# Patient Record
Sex: Female | Born: 1960 | Race: White | Hispanic: No | Marital: Married | Smoking: Never smoker
Health system: Southern US, Community
[De-identification: ages and names within clinical notes are randomized; demographics above are authoritative.]

## PROBLEM LIST (undated history)

## (undated) DIAGNOSIS — G8929 Other chronic pain: Secondary | ICD-10-CM

## (undated) DIAGNOSIS — T7840XA Allergy, unspecified, initial encounter: Secondary | ICD-10-CM

## (undated) DIAGNOSIS — F419 Anxiety disorder, unspecified: Secondary | ICD-10-CM

## (undated) DIAGNOSIS — I10 Essential (primary) hypertension: Secondary | ICD-10-CM

## (undated) DIAGNOSIS — Z9289 Personal history of other medical treatment: Secondary | ICD-10-CM

## (undated) DIAGNOSIS — N390 Urinary tract infection, site not specified: Secondary | ICD-10-CM

## (undated) DIAGNOSIS — L409 Psoriasis, unspecified: Secondary | ICD-10-CM

## (undated) DIAGNOSIS — R519 Headache, unspecified: Secondary | ICD-10-CM

## (undated) DIAGNOSIS — N200 Calculus of kidney: Secondary | ICD-10-CM

## (undated) DIAGNOSIS — K859 Acute pancreatitis without necrosis or infection, unspecified: Secondary | ICD-10-CM

## (undated) DIAGNOSIS — K219 Gastro-esophageal reflux disease without esophagitis: Secondary | ICD-10-CM

## (undated) DIAGNOSIS — R51 Headache: Secondary | ICD-10-CM

## (undated) HISTORY — DX: Gastro-esophageal reflux disease without esophagitis: K21.9

## (undated) HISTORY — PX: COLONOSCOPY W/ POLYPECTOMY: SHX1380

## (undated) HISTORY — DX: Acute pancreatitis without necrosis or infection, unspecified: K85.90

## (undated) HISTORY — DX: Essential (primary) hypertension: I10

## (undated) HISTORY — DX: Urinary tract infection, site not specified: N39.0

## (undated) HISTORY — PX: LEG SURGERY: SHX1003

## (undated) HISTORY — DX: Headache: R51

## (undated) HISTORY — DX: Allergy, unspecified, initial encounter: T78.40XA

## (undated) HISTORY — DX: Anxiety disorder, unspecified: F41.9

## (undated) HISTORY — DX: Other chronic pain: G89.29

## (undated) HISTORY — PX: LITHOTRIPSY: SUR834

## (undated) HISTORY — DX: Personal history of other medical treatment: Z92.89

## (undated) HISTORY — PX: KIDNEY STONE SURGERY: SHX686

## (undated) HISTORY — PX: FOOT FRACTURE SURGERY: SHX645

## (undated) HISTORY — DX: Headache, unspecified: R51.9

## (undated) HISTORY — DX: Psoriasis, unspecified: L40.9

## (undated) HISTORY — PX: WISDOM TOOTH EXTRACTION: SHX21

## (undated) HISTORY — PX: CHOLECYSTECTOMY: SHX55

---

## 1998-11-06 ENCOUNTER — Other Ambulatory Visit: Admission: RE | Admit: 1998-11-06 | Discharge: 1998-11-06 | Payer: Self-pay | Admitting: Obstetrics & Gynecology

## 2000-01-12 ENCOUNTER — Other Ambulatory Visit: Admission: RE | Admit: 2000-01-12 | Discharge: 2000-01-12 | Payer: Self-pay | Admitting: Obstetrics & Gynecology

## 2000-01-24 ENCOUNTER — Emergency Department (HOSPITAL_COMMUNITY): Admission: EM | Admit: 2000-01-24 | Discharge: 2000-01-24 | Payer: Self-pay | Admitting: *Deleted

## 2000-01-24 ENCOUNTER — Encounter: Payer: Self-pay | Admitting: *Deleted

## 2000-02-18 ENCOUNTER — Encounter: Admission: RE | Admit: 2000-02-18 | Discharge: 2000-02-18 | Payer: Self-pay | Admitting: Urology

## 2000-02-18 ENCOUNTER — Encounter: Payer: Self-pay | Admitting: Urology

## 2000-02-22 ENCOUNTER — Ambulatory Visit (HOSPITAL_COMMUNITY): Admission: RE | Admit: 2000-02-22 | Discharge: 2000-02-22 | Payer: Self-pay | Admitting: Urology

## 2000-02-22 ENCOUNTER — Encounter: Payer: Self-pay | Admitting: Urology

## 2000-03-14 ENCOUNTER — Encounter: Payer: Self-pay | Admitting: Urology

## 2000-03-15 ENCOUNTER — Inpatient Hospital Stay (HOSPITAL_COMMUNITY): Admission: EM | Admit: 2000-03-15 | Discharge: 2000-03-17 | Payer: Self-pay | Admitting: Urology

## 2000-03-16 ENCOUNTER — Encounter: Payer: Self-pay | Admitting: Urology

## 2000-03-17 ENCOUNTER — Encounter: Payer: Self-pay | Admitting: Urology

## 2000-04-18 ENCOUNTER — Ambulatory Visit (HOSPITAL_COMMUNITY): Admission: RE | Admit: 2000-04-18 | Discharge: 2000-04-18 | Payer: Self-pay | Admitting: Urology

## 2001-01-04 ENCOUNTER — Encounter: Payer: Self-pay | Admitting: Urology

## 2001-01-04 ENCOUNTER — Encounter: Admission: RE | Admit: 2001-01-04 | Discharge: 2001-01-04 | Payer: Self-pay | Admitting: Urology

## 2001-03-27 ENCOUNTER — Other Ambulatory Visit: Admission: RE | Admit: 2001-03-27 | Discharge: 2001-03-27 | Payer: Self-pay | Admitting: Obstetrics & Gynecology

## 2001-09-04 ENCOUNTER — Encounter: Payer: Self-pay | Admitting: Gastroenterology

## 2002-09-03 ENCOUNTER — Other Ambulatory Visit: Admission: RE | Admit: 2002-09-03 | Discharge: 2002-09-03 | Payer: Self-pay | Admitting: Obstetrics & Gynecology

## 2003-12-13 ENCOUNTER — Other Ambulatory Visit: Admission: RE | Admit: 2003-12-13 | Discharge: 2003-12-13 | Payer: Self-pay | Admitting: Obstetrics & Gynecology

## 2005-03-01 ENCOUNTER — Other Ambulatory Visit: Admission: RE | Admit: 2005-03-01 | Discharge: 2005-03-01 | Payer: Self-pay | Admitting: Obstetrics & Gynecology

## 2006-05-05 ENCOUNTER — Emergency Department (HOSPITAL_COMMUNITY): Admission: EM | Admit: 2006-05-05 | Discharge: 2006-05-06 | Payer: Self-pay | Admitting: Emergency Medicine

## 2008-08-29 ENCOUNTER — Ambulatory Visit: Payer: Self-pay | Admitting: Gastroenterology

## 2008-08-29 ENCOUNTER — Telehealth: Payer: Self-pay | Admitting: Gastroenterology

## 2008-08-29 LAB — CONVERTED CEMR LAB
ALT: 37 U/L — ABNORMAL HIGH
AST: 39 U/L — ABNORMAL HIGH
Albumin: 2.8 g/dL — ABNORMAL LOW
Alkaline Phosphatase: 157 U/L — ABNORMAL HIGH
Amylase: 62 U/L
BUN: 13 mg/dL
Basophils Absolute: 0.3 K/uL — ABNORMAL HIGH
Basophils Relative: 2.7 %
CO2: 26 meq/L
Calcium: 9.2 mg/dL
Chloride: 100 meq/L
Creatinine, Ser: 1.5 mg/dL — ABNORMAL HIGH
Eosinophils Absolute: 0.1 K/uL
Eosinophils Relative: 0.5 %
GFR calc non Af Amer: 39.63 mL/min
Glucose, Bld: 105 mg/dL — ABNORMAL HIGH
HCT: 37 %
Hemoglobin: 13.2 g/dL
Lipase: 14 U/L
Lymphocytes Relative: 10.6 % — ABNORMAL LOW
Lymphs Abs: 1.3 K/uL
MCHC: 35.8 g/dL
MCV: 93 fL
Monocytes Absolute: 1.6 K/uL — ABNORMAL HIGH
Monocytes Relative: 13 % — ABNORMAL HIGH
Neutro Abs: 9.1 K/uL — ABNORMAL HIGH
Neutrophils Relative %: 73.2 %
Platelets: 278 K/uL
Potassium: 4.1 meq/L
RBC: 3.98 M/uL
RDW: 11.3 % — ABNORMAL LOW
Sodium: 137 meq/L
Total Bilirubin: 1 mg/dL
Total Protein: 6.9 g/dL
WBC: 12.4 10*3/microliter — ABNORMAL HIGH

## 2008-08-30 ENCOUNTER — Ambulatory Visit: Payer: Self-pay | Admitting: Gastroenterology

## 2008-08-30 ENCOUNTER — Ambulatory Visit (HOSPITAL_COMMUNITY): Admission: RE | Admit: 2008-08-30 | Discharge: 2008-08-30 | Payer: Self-pay | Admitting: Gastroenterology

## 2008-08-30 DIAGNOSIS — R945 Abnormal results of liver function studies: Secondary | ICD-10-CM

## 2008-08-30 DIAGNOSIS — R1011 Right upper quadrant pain: Secondary | ICD-10-CM

## 2008-08-30 DIAGNOSIS — R110 Nausea: Secondary | ICD-10-CM

## 2008-08-30 DIAGNOSIS — R1012 Left upper quadrant pain: Secondary | ICD-10-CM | POA: Insufficient documentation

## 2008-08-30 DIAGNOSIS — R1013 Epigastric pain: Secondary | ICD-10-CM

## 2008-09-02 ENCOUNTER — Ambulatory Visit: Payer: Self-pay | Admitting: Physician Assistant

## 2008-09-03 LAB — CONVERTED CEMR LAB
ALT: 154 units/L — ABNORMAL HIGH (ref 0–35)
AST: 131 units/L — ABNORMAL HIGH (ref 0–37)
Albumin: 2.8 g/dL — ABNORMAL LOW (ref 3.5–5.2)
Alkaline Phosphatase: 126 units/L — ABNORMAL HIGH (ref 39–117)
BUN: 9 mg/dL (ref 6–23)
Basophils Absolute: 0.3 10*3/uL — ABNORMAL HIGH (ref 0.0–0.1)
Basophils Relative: 2.4 % (ref 0.0–3.0)
CO2: 28 meq/L (ref 19–32)
Calcium: 8.8 mg/dL (ref 8.4–10.5)
Chloride: 107 meq/L (ref 96–112)
Creatinine, Ser: 1.3 mg/dL — ABNORMAL HIGH (ref 0.4–1.2)
Eosinophils Absolute: 0.1 10*3/uL (ref 0.0–0.7)
Eosinophils Relative: 1 % (ref 0.0–5.0)
GFR calc non Af Amer: 46.74 mL/min (ref >60)
Glucose, Bld: 167 mg/dL — ABNORMAL HIGH (ref 70–99)
HCT: 36.6 % (ref 36.0–46.0)
Hemoglobin: 12.9 g/dL (ref 12.0–15.0)
Lymphocytes Relative: 15.6 % (ref 12.0–46.0)
Lymphs Abs: 1.9 10*3/uL (ref 0.7–4.0)
MCHC: 35.2 g/dL (ref 30.0–36.0)
MCV: 94.6 fL (ref 78.0–100.0)
Monocytes Absolute: 0.7 10*3/uL (ref 0.1–1.0)
Monocytes Relative: 5.6 % (ref 3.0–12.0)
Neutro Abs: 9.2 10*3/uL — ABNORMAL HIGH (ref 1.4–7.7)
Neutrophils Relative %: 75.4 % (ref 43.0–77.0)
Platelets: 404 10*3/uL — ABNORMAL HIGH (ref 150.0–400.0)
Potassium: 4.1 meq/L (ref 3.5–5.1)
RBC: 3.87 M/uL (ref 3.87–5.11)
RDW: 11.1 % — ABNORMAL LOW (ref 11.5–14.6)
Sodium: 141 meq/L (ref 135–145)
Total Bilirubin: 0.7 mg/dL (ref 0.3–1.2)
Total Protein: 7.1 g/dL (ref 6.0–8.3)
WBC: 12.2 10*3/uL — ABNORMAL HIGH (ref 4.5–10.5)

## 2008-09-06 ENCOUNTER — Ambulatory Visit: Payer: Self-pay | Admitting: Gastroenterology

## 2008-09-06 DIAGNOSIS — Z85038 Personal history of other malignant neoplasm of large intestine: Secondary | ICD-10-CM | POA: Insufficient documentation

## 2008-10-28 ENCOUNTER — Ambulatory Visit: Payer: Self-pay | Admitting: Gastroenterology

## 2008-10-29 LAB — CONVERTED CEMR LAB
Albumin: 3.6 g/dL (ref 3.5–5.2)
Bilirubin, Direct: 0.1 mg/dL (ref 0.0–0.3)
Total Protein: 6.7 g/dL (ref 6.0–8.3)

## 2008-11-04 ENCOUNTER — Ambulatory Visit: Payer: Self-pay | Admitting: Gastroenterology

## 2008-11-04 DIAGNOSIS — K859 Acute pancreatitis without necrosis or infection, unspecified: Secondary | ICD-10-CM | POA: Insufficient documentation

## 2008-11-05 LAB — CONVERTED CEMR LAB
Iron: 95 ug/dL (ref 42–145)
Saturation Ratios: 28.5 % (ref 20.0–50.0)
Transferrin: 238.1 mg/dL (ref 212.0–360.0)

## 2009-11-10 ENCOUNTER — Telehealth (INDEPENDENT_AMBULATORY_CARE_PROVIDER_SITE_OTHER): Payer: Self-pay | Admitting: *Deleted

## 2009-11-19 ENCOUNTER — Ambulatory Visit: Payer: Self-pay | Admitting: Gastroenterology

## 2009-11-20 LAB — CONVERTED CEMR LAB: Transferrin: 254.1 mg/dL (ref 212.0–360.0)

## 2009-12-24 ENCOUNTER — Ambulatory Visit: Payer: Self-pay | Admitting: Gastroenterology

## 2009-12-24 LAB — CONVERTED CEMR LAB
ALT: 21 units/L (ref 0–35)
AST: 23 units/L (ref 0–37)
Alkaline Phosphatase: 51 units/L (ref 39–117)
Bilirubin, Direct: 0.1 mg/dL (ref 0.0–0.3)
Total Protein: 6.6 g/dL (ref 6.0–8.3)

## 2010-02-24 NOTE — Assessment & Plan Note (Signed)
  Review of gastrointestinal problems: 1. Abnormal liver tests in the setting of recent complex urinary tract infection, summer 2010: ultrasound showed nondilated ducts, essentially normal liver. Transaminases increased and then decreased rather quickly. We suspected her urinary tract infection was causing the elevated liver tests.  Gallbladder removed remotely. Pancreatitis also remotely.  Repeat liver tests October, 2010 normal. 2. 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  October, 2011: ferritin 50,  Iron 95, iron saturation 29%    History of Present Illness Visit Type: Follow-up Visit Primary GI MD: Rob Bunting MD Primary Provider: Annamaria Helling, MD Requesting Provider: na Chief Complaint: Follow up of elevated lfts History of Present Illness:     very pleasant 50 year old woman whom I last saw about one year ago.  she has been doing overall well. still battling kidney stones.  No obvious liver symptoms ( no jaundice, no diagnosis of hepatitis, no specific right upper quadrant discomforts).  No overt Gi bleeding.  iron studies done last month were all completely normal with a ferritin of 50.   GI Review of Systems      Denies abdominal pain, acid reflux, belching, bloating, chest pain, dysphagia with liquids, dysphagia with solids, heartburn, loss of appetite, nausea, vomiting, vomiting blood, weight loss, and  weight gain.        Denies anal fissure, black tarry stools, change in bowel habit, constipation, diarrhea, diverticulosis, fecal incontinence, heme positive stool, hemorrhoids, irritable bowel syndrome, jaundice, light color stool, liver problems, rectal bleeding, and  rectal pain.    Current Medications (verified): 1)  Zomig 5 Mg Tabs (Zolmitriptan) .Marland Kitchen.. 1 By Mouth Once Daily For Migrains As Needed 2)  Metoprolol Succinate 25 Mg Xr24h-Tab (Metoprolol Succinate) .Marland Kitchen.. 1 By Mouth Once Daily 3)  Zyrtec Hives  Relief 10 Mg Tabs (Cetirizine Hcl) .Marland Kitchen.. 1 By Mouth Once Daily As Needed 4)  Bcp .Marland Kitchen.. 1 By Mouth Once Daily As Directed  Allergies (verified): No Known Drug Allergies  Vital Signs:  Patient profile:   50 year old female Height:      66 inches Weight:      165.8 pounds BMI:     26.86 Pulse rate:   60 / minute Pulse rhythm:   regular BP sitting:   130 / 80  (left arm) Cuff size:   regular  Vitals Entered By: Harlow Mares CMA Duncan Dull) (December 24, 2009 8:41 AM)  Physical Exam  Additional Exam:  Constitutional: generally well appearing Psychiatric: alert and oriented times 3 Abdomen: soft, non-tender, non-distended, normal bowel sounds    Impression & Recommendations:  Problem # 1:  Compound heterozygote for hemochromatosis no sign of iron overload clinically or by iron studies. She will get a repeat set of liver tests today.  She is still menstruating. We will likely repeat liver tests and iron studies annually for the next several years to see if she is going to start to iron overload.  Other Orders: TLB-Hepatic/Liver Function Pnl (80076-HEPATIC)  Patient Instructions: 1)  You will get lab test(s) done today (LFTs). 2)  We will repeat LFTs and iron studies(ferritin, TIBC, total iron) in one year and contact you. 3)  A copy of this information will be sent to Annamaria Helling, MD. 4)  The medication list was reviewed and reconciled.  All changed / newly prescribed medications were explained.  A complete medication list was provided to the patient / caregiver.

## 2010-02-24 NOTE — Progress Notes (Signed)
Summary: lab reminder  Phone Note Outgoing Call Call back at Home Phone [phone]   Call placed by: Chales Abrahams CMA Duncan Dull),  November 10, 2009 9:03 AM Summary of Call: Pt called and reminded to have labs pt will be in this week Initial call taken by: Chales Abrahams CMA Duncan Dull),  November 10, 2009 9:03 AM

## 2010-06-12 NOTE — Op Note (Signed)
[facility]  Patient:    YARIS, FERRELL                         MRN: [id number] Proc. Date: 03/14/00 Adm. Date:  [id number] Attending:  Laqueta Jean                           Operative Report  PREOPERATIVE DIAGNOSIS:  Left hydronephrosis.  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  OPERATION:  Cystourethroscopy, left retrograde pyelogram, attempted laser of left ureteral stricture, attempt at left double-J catheter.  SURGEON:  Sigmund I. Patsi Sears, M.D.  ANESTHESIA:  General mask (LMA).  INDICATIONS:  Review of history showed that this 50 year old female is status post basket extraction of a 7 mm left lower ureteral stone in 1998 during pregnancy.  Double-J catheter eventually removed, and the patient did well until presenting to the emergency room on January 24, 2000, with nausea and left flank pain.  She then passed the stone the next done and evaluation is pending.  The stone appeared to be 1-2 mm.  The left ureter could not be well identified, except for hydronephrosis.  The patient now presents for retrograde pyelogram on the left side.  DESCRIPTION OF PROCEDURE:  The patient was brought to the operating room and placed on the operating table in the dorsal supine position where general mask (LMA) anesthesia was introduced.  She was then replaced in the dorsal lithotomy position where the pubis was prepped with Betadine solution in the usual fashion.  Cystourethroscopy was accomplished, and shows a normal right ureteral orifice but a golf ball size type left ureteral orifice.  Right retrograde pyelogram was performed and shows a ureteral stricture.  Ureteroscopy was accomplished with the smallest ureteroscope, and shows a concentric target type stricture of the left lower ureter, intramural portion.  A small yellowish stone is seen above that, but the strictured area could not be dilated.   A guidewire was passed through the strictured area but could not be passed around the stones into the kidney.  The area is not completely obstructed, however.  Incision of the stricture is attempted but could not be accomplished.  Therefore, the procedure was terminated.  The patient will have Percodan, his nephrostomy placed and antegrade nephrostogram.  She may be able to have balloon dilatation of the left ureteral stricture, and may eventually have retrograde basket extraction of her stones.  She may need to have left ureteral reimplantation.  I have discussed the case with the patients family including the husband, mother, father and grandfather. DD:  03/14/00 TD:  03/14/00 Job: [id number]

## 2010-06-12 NOTE — Op Note (Signed)
[facility]  Patient:    [name], Lauren Forbes                         MRN: [id number] Proc. Date: 03/14/00 Adm. Date:  [id number] Attending:  Laqueta Jean                           Operative Report  PREOPERATIVE DIAGNOSIS:  Left hydronephrosis.  POSTOPERATIVE DIAGNOSES: 1. Left lower ureteral stricture. 2. Three ureteral stones.  OPERATIVE PROCEDURES: 1. Cystourethroscopy. 2. Left retrograde pyelogram. 3. Attempted laser of left ureteral stricture, attempted left    double J catheter.  SURGEON: Sigmund I. Patsi Sears, M.D.  ANESTHESIA:  General mask (LMA).  PREPARATION:  After appropriate preanesthesia, the patient was brought to the operating room and placed on the operating room table in the dorsosupine position where general mask (LMA) anesthesia was introduced. She was then replaced in the dorsal lithotomy position where the pubis was prepped with Betadine solution and draped in the usual fashion.  INDICATIONS:  Review of history shows that this 50 year old female is status post basket extraction of 7 mm left lower ureteral stone in 1998 during pregnancy.  The double J catheter was eventually removed and the patient did well until presenting to the emergency room on January 24, 2000 with nausea and left flank pain. She did pass the stone the next day and evaluation is pending. The stone appeared to be 1-2 mm.  The left ureter could not be well identified, except for hydronephrosis, and the patient now presents for retrograde pyelogram on the left side.  DESCRIPTION OF PROCEDURE:  The cystourethroscopy was accomplished. It shows a normal right ureteral orifice but an golf-ball type left ureteral orifice. Right ureteral pyelogram was performed and shows a ureteral stricture. Ureteroscopy was accomplished with the smallest ureteroscope and shows a concentric target type stricture of the left lower ureter, intramural portion. A small  yellowish stone is seen above that, but the stricture area cannot be dilated. A guidewire was passed through the stricture area but could not be passed around the stones into the kidney. The area is not completely obstructed, however. Laser incision of the stricture is accomplished, but cannot be accomplished, therefore, the procedure was terminated and the patient will have a percutaneous nephrostomy placed and an antegrade nephrostogram. She may be able to have balloon dilation of the left ureteral stricture and may eventually have retrograde basket extraction of her stones. She may need to have left ureteral reimplantation. I have discussed the case with the patients family including husband, mother and father and grandfather. DD:  03/14/00 TD:  03/14/00 Job: [id number]

## 2010-06-12 NOTE — Op Note (Signed)
[facility]  Patient:    Lauren Forbes, Lauren Forbes                         MRN: [id number] Proc. Date: 04/18/00 Adm. Date:  [id number] Attending:  Laqueta Jean                           Operative Report  PREOPERATIVE DIAGNOSIS:  Left lower ureteral stricture, possible left lower ureteral stone.  POSTOPERATIVE DIAGNOSIS:  Left lower ureteral stricture, possible left lower ureteral stone.  OPERATION: 1. Cystourethroscopy. 2. Left retrograde pyelogram. 3. Ureteroscopy. 4.  Removal of JJ.  SURGEON:  Sigmund I. Patsi Sears, M.D.  ANESTHESIA:  General.  PROCEDURE PREPARATION:  After appropriate preanesthesia, the patient was brought to the operating room and placed on the operating table in the dorsal supine position where general endotracheal anesthesia was introduced.  She was then replaced in the low Allen stirrup dorsolithotomy position where the pubis was prepped with Betadine solution and draped in the usual fashion.  DESCRIPTION OF PROCEDURE:   Cystoscopy was accomplished.  JJ catheter was identified and brought out through the urethral meatus.  Guidewire was then passed retrograde into the renal pelvis.  Following this, ureteroscopy was accomplished.  There appeared to be previous ureteral stricture with a small dilated segment just distal to the strictured area which had previously been dilated.  The ureteroscope went up easily.  There was no stone noted, but there was a stone embedded in the ureter distal to the strictured area and post stricture dilated area, which appeared to have some calcium within it, and was removed.  There was no embedded stone, however.  It was elected not to leave the JJ catheter, and Xylocaine jelly was placed in the ureter.  B & O suppository was placed, and the patient was awakened and taken to the recovery room in good condition. DD:  04/18/00 TD:  04/19/00 Job: [id number]

## 2010-06-12 NOTE — Discharge Summary (Signed)
[facility]  Patient:    Lauren Forbes, Lauren Forbes                         MRN: [id number] Adm. Date:  [id number] Disc. Date: [id number] Attending:  Laqueta Jean                           Discharge Summary  DISCHARGE DIAGNOSES: 1. Left lower ureteral stricture. 2. Left lower ureteral calculus.  PROCEDURES: 1. On March 14, 2000, cystourethroscopy and attempted left ureteral catheterization. 2. Left percutaneous nephrostomy. 3. Left percutaneous antegrade double J catheter.  HISTORY OF PRESENT ILLNESS:  Ms. Lauren Forbes is a 50 year old, white married female with a history of left ureteral stone extraction while pregnant several years ago.  The patient has done well for several years, but returned with left flank pain and x-ray shows what appears to be three left ureteral calculi. She is admitted via the operating room following failed ability to extract a calculi secondary to dense circumferential left ureterovesical junction stricture.  PAST MEDICAL HISTORY:  Otherwise, noncontributory.  ALLERGIES:  No known drug allergies.  MEDICATIONS:  None.  SOCIAL HISTORY:  No alcohol or tobacco use.  LABORATORY DATA AND X-RAY FINDINGS:  Urine pregnancy test negative. Hemoglobin 13.25, hematocrit 39.2.  HOSPITAL COURSE:  The patient was admitted and taken to the operating room. She had left percutaneous nephrostomy placed and then left antegrade nephrostogram with percutaneous double J catheter antegrade placement.  The patient was felt to have a single stone in the left ureter and possibly two phlebolith, although the stones appear to line up as a column of three stones. The patient will have double J catheter in place for the next six weeks and then have ureteroscopy and balloon dilation and basket extraction of stones. She may eventually need to have ureteral reimplantation. DD:  03/25/00 TD:  03/28/00 Job: [id number]

## 2010-09-23 ENCOUNTER — Ambulatory Visit
Admission: RE | Admit: 2010-09-23 | Discharge: 2010-09-23 | Disposition: A | Discharge status: Home / Self Care | Payer: 59 | Source: Ambulatory Visit | Attending: Chiropractic Medicine | Admitting: Chiropractic Medicine

## 2010-09-23 ENCOUNTER — Other Ambulatory Visit: Payer: Self-pay | Admitting: Chiropractic Medicine

## 2010-09-23 DIAGNOSIS — R51 Headache: Secondary | ICD-10-CM

## 2010-09-23 DIAGNOSIS — M542 Cervicalgia: Secondary | ICD-10-CM

## 2010-09-23 DIAGNOSIS — M5430 Sciatica, unspecified side: Secondary | ICD-10-CM

## 2010-12-23 ENCOUNTER — Emergency Department (HOSPITAL_COMMUNITY)
Admission: EM | Admit: 2010-12-23 | Discharge: 2010-12-23 | Disposition: A | Discharge status: Home / Self Care | Payer: 59 | Source: Home / Self Care | Attending: Emergency Medicine | Admitting: Emergency Medicine

## 2010-12-23 DIAGNOSIS — T7840XA Allergy, unspecified, initial encounter: Secondary | ICD-10-CM

## 2010-12-23 DIAGNOSIS — N390 Urinary tract infection, site not specified: Secondary | ICD-10-CM

## 2010-12-23 DIAGNOSIS — T783XXA Angioneurotic edema, initial encounter: Secondary | ICD-10-CM

## 2010-12-23 HISTORY — DX: Essential (primary) hypertension: I10

## 2010-12-23 HISTORY — DX: Calculus of kidney: N20.0

## 2010-12-23 LAB — POCT URINALYSIS DIP (DEVICE)
Bilirubin Urine: NEGATIVE
Glucose, UA: 100 mg/dL — AB
Ketones, ur: NEGATIVE mg/dL
Nitrite: POSITIVE — AB

## 2010-12-23 LAB — POCT PREGNANCY, URINE: Preg Test, Ur: NEGATIVE

## 2010-12-23 MED ORDER — SULFAMETHOXAZOLE-TRIMETHOPRIM 800-160 MG PO TABS: 1.0000 | ORAL_TABLET | Freq: Two times a day (BID) | ORAL | Status: AC

## 2010-12-23 MED ORDER — DIPHENHYDRAMINE HCL 25 MG PO CAPS
25.0000 mg | ORAL_CAPSULE | Freq: Once | ORAL | Status: AC
  Administered 2010-12-23: 25 mg via ORAL

## 2010-12-23 MED ORDER — METHYLPREDNISOLONE SODIUM SUCC 125 MG IJ SOLR
INTRAMUSCULAR | Status: AC
  Filled 2010-12-23: qty 2

## 2010-12-23 MED ORDER — METHYLPREDNISOLONE SODIUM SUCC 125 MG IJ SOLR: 125.0000 mg | Freq: Once | INTRAMUSCULAR | Status: DC

## 2010-12-23 MED ORDER — METHYLPREDNISOLONE SODIUM SUCC 125 MG IJ SOLR
125.0000 mg | Freq: Once | INTRAMUSCULAR | Status: AC
  Administered 2010-12-23: 125 mg via INTRAMUSCULAR

## 2010-12-23 MED ORDER — EPINEPHRINE 0.3 MG/0.3ML IJ DEVI: 0.3000 mg | Freq: Once | INTRAMUSCULAR | Status: DC

## 2010-12-23 MED ORDER — FAMOTIDINE 20 MG PO TABS: 20.0000 mg | ORAL_TABLET | Freq: Two times a day (BID) | ORAL | Status: DC

## 2010-12-23 MED ORDER — PREDNISONE 20 MG PO TABS: 60.0000 mg | ORAL_TABLET | Freq: Every day | ORAL | Status: AC

## 2010-12-23 MED ORDER — DIPHENHYDRAMINE HCL 25 MG PO CAPS
ORAL_CAPSULE | ORAL | Status: AC
  Filled 2010-12-23: qty 1

## 2010-12-23 NOTE — ED Provider Notes (Signed)
History     CSN: [id number] Arrival date & time: 12/23/2010  8:07 PM   First MD Initiated Contact with Patient 12/23/10 2021      Chief Complaint  Patient presents with  . Allergic Reaction    HPI Comments: Pt with lip swelling, itching around lips, tongue, and in mouth starting aroung 1600 today. Reports hoarseness. Took 25 mg benadryl with partial relief. Reports blisters on inside of lips. States sx identical to previous episodes of allergic rxns when she comes into contact with seafood. No rash, N/V, abd pain, diarrhea, SOB, wheezing Started azo today for uti starting yesterday Is planning on seeing PMD for this tomorrow. Reports oderous, cloudy urine, dysuria, frequency starting yesterday. No abd pain, back pain, fevers. No other new medicaitons, lotions, soaps, detergents.   Patient is a 50 y.o. female presenting with allergic reaction. The history is provided by the patient.  Allergic Reaction The primary symptoms are  angioedema. The primary symptoms do not include wheezing, shortness of breath, nausea, vomiting, diarrhea or rash. The current episode started 6 to 12 hours ago. The problem has been gradually worsening. This is a new problem.  The angioedema is not associated with shortness of breath.  Significant symptoms also include eye redness and itching. Significant symptoms that are not present include flushing or rhinorrhea.    Past Medical History  Diagnosis Date  . Hypertension   . Kidney stones     Past Surgical History  Procedure Date  . Cholecystectomy   . Lithotripsy     History reviewed. No pertinent family history.  History  Substance Use Topics  . Smoking status: Never Smoker   . Smokeless tobacco: Not on file  . Alcohol Use: Yes    OB History    Grav Para Term Preterm Abortions TAB SAB Ect Mult Living                  Review of Systems  Constitutional: Negative for fever.  HENT: Negative for sore throat, facial swelling, rhinorrhea, drooling,  trouble swallowing and voice change.   Eyes: Positive for redness.  Respiratory: Negative for shortness of breath and wheezing.   Cardiovascular: Negative for chest pain.  Gastrointestinal: Negative.  Negative for nausea, vomiting and diarrhea.  Genitourinary: Positive for dysuria, urgency and frequency. Negative for hematuria, vaginal bleeding, vaginal discharge and pelvic pain.  Skin: Positive for itching. Negative for flushing and rash.    Allergies  Sea omega  Home Medications   Current Outpatient Rx  Name Route Sig Dispense Refill  . METOPROLOL SUCCINATE 100 MG PO TB24 Oral Take 100 mg by mouth daily.      Marland Kitchen NORGESTREL-ETHINYL ESTRADIOL 0.3-30 MG-MCG PO TABS Oral Take 1 tablet by mouth daily.      Marland Kitchen EPINEPHRINE 0.3 MG/0.3ML IJ DEVI Intramuscular Inject 0.3 mLs (0.3 mg total) into the muscle once. 1 Device 1  . FAMOTIDINE 20 MG PO TABS Oral Take 1 tablet (20 mg total) by mouth 2 (two) times daily. 10 tablet 0  . PREDNISONE 20 MG PO TABS Oral Take 3 tablets (60 mg total) by mouth daily. 15 tablet 0  . SULFAMETHOXAZOLE-TRIMETHOPRIM 800-160 MG PO TABS Oral Take 1 tablet by mouth 2 (two) times daily. X 7 days 14 tablet 0    BP 146/100  Pulse 62  Temp(Src) 98.3 F (36.8 C) (Oral)  Resp 12  SpO2 100%  LMP 11/22/2010  Physical Exam  Nursing note and vitals reviewed. Constitutional: She is oriented to person,  place, and time. She appears well-developed and well-nourished. No distress.  HENT:  Head: Normocephalic and atraumatic.  Nose: Nose normal.  Mouth/Throat: Uvula is midline and oropharynx is clear and moist.       Angioedema. No ulcers noted on lips or palate. Airway widely patent.   Eyes: EOM are normal. Pupils are equal, round, and reactive to light.       Mild bl conjunctival injection  Neck: Normal range of motion.  Cardiovascular: Normal rate, regular rhythm and normal heart sounds.   Pulmonary/Chest: Effort normal and breath sounds normal.  Abdominal: Soft. Bowel  sounds are normal. She exhibits no distension. There is tenderness in the suprapubic area. There is CVA tenderness.       Mild L CVA tenderness  Musculoskeletal: Normal range of motion.  Neurological: She is alert and oriented to person, place, and time.  Skin: Skin is warm and dry. No rash noted.  Psychiatric: She has a normal mood and affect. Her behavior is normal. Judgment and thought content normal.    ED Course  Procedures (including critical care time)    1. Allergic reaction   2. Angioedema of lips   3. UTI (lower urinary tract infection)    . Results for orders placed during the hospital encounter of 12/23/10  POCT URINALYSIS DIP (DEVICE)      Component Value Range   Glucose, UA 100 (*) NEGATIVE (mg/dL)   Bilirubin Urine NEGATIVE  NEGATIVE    Ketones, ur NEGATIVE  NEGATIVE (mg/dL)   Specific Gravity, Urine <=1.005  1.005 - 1.030    Hgb urine dipstick MODERATE (*) NEGATIVE    pH 6.0  5.0 - 8.0    Protein, ur 30 (*) NEGATIVE (mg/dL)   Urobilinogen, UA 1.0  0.0 - 1.0 (mg/dL)   Nitrite POSITIVE (*) NEGATIVE    Leukocytes, UA LARGE (*) NEGATIVE   POCT PREGNANCY, URINE      Component Value Range   Preg Test, Ur NEGATIVE       MDM  2130-Pt seen and examined appears to be mild allergic rxn no evidence of airway compromise at this time will tx with benadryl, solumedrol. Will re-evaluate. Checking ua.    2230- On re-evaluation, pt comfortable, VSS, no complaints.  Angioedema improved, airway patent.  Discussed lab results with patient. Emphasized importance of f/u. Pt agrees to f/u with Dr. Patsi Sears her urologist tomorrow re: UTI. Will tx this as complicated UTI. No evidence of obstructing stone or pyleo at this time. Pt states has done well on bactrim in the past. Sending urine off for cx.   Luiz Blare, MD 12/23/10 2236

## 2010-12-23 NOTE — ED Notes (Signed)
C/o she is allergic to seafood, no known exposure today; c/o aprox 2 h PTA  her mouth , lips, tongue feel as if they are itching and swelling; NAD at present, no drooling observed; had used benadryl at home for her syx

## 2010-12-31 ENCOUNTER — Telehealth: Payer: Self-pay

## 2010-12-31 NOTE — Telephone Encounter (Signed)
Pt is aware to come in for annual labs

## 2011-09-28 ENCOUNTER — Telehealth: Payer: Self-pay

## 2011-09-28 DIAGNOSIS — Z8719 Personal history of other diseases of the digestive system: Secondary | ICD-10-CM

## 2011-09-28 NOTE — Telephone Encounter (Signed)
Pt aware to come in for labs  

## 2011-10-12 ENCOUNTER — Other Ambulatory Visit (INDEPENDENT_AMBULATORY_CARE_PROVIDER_SITE_OTHER): Payer: 59

## 2011-10-12 DIAGNOSIS — R7989 Other specified abnormal findings of blood chemistry: Secondary | ICD-10-CM

## 2011-10-12 LAB — FERRITIN: Ferritin: 36 ng/mL (ref 10.0–291.0)

## 2011-10-12 LAB — IBC PANEL
Iron: 65 ug/dL (ref 42–145)
Transferrin: 239.4 mg/dL (ref 212.0–360.0)

## 2011-10-18 ENCOUNTER — Other Ambulatory Visit: Payer: Self-pay

## 2011-10-18 DIAGNOSIS — R109 Unspecified abdominal pain: Secondary | ICD-10-CM

## 2012-10-17 ENCOUNTER — Telehealth: Payer: Self-pay

## 2012-10-17 NOTE — Telephone Encounter (Signed)
Message copied by Donata Duff on Tue Oct 17, 2012  9:51 AM ------      Message from: Donata Duff      Created: Mon Oct 18, 2011  1:57 PM       PT TO GET LABS SEE 10/18/11 ------

## 2012-10-17 NOTE — Telephone Encounter (Signed)
The patient has been notified of this information and all questions answered.

## 2012-10-27 ENCOUNTER — Other Ambulatory Visit: Payer: Self-pay

## 2012-10-27 ENCOUNTER — Other Ambulatory Visit (INDEPENDENT_AMBULATORY_CARE_PROVIDER_SITE_OTHER): Payer: 59

## 2012-10-27 DIAGNOSIS — R7989 Other specified abnormal findings of blood chemistry: Secondary | ICD-10-CM

## 2013-05-02 ENCOUNTER — Telehealth: Payer: Self-pay

## 2013-05-02 DIAGNOSIS — K859 Acute pancreatitis without necrosis or infection, unspecified: Secondary | ICD-10-CM

## 2013-05-02 NOTE — Telephone Encounter (Signed)
Message copied by Barron Alvine on Wed May 02, 2013  8:11 AM ------      Message from: LEWIS, Yecheskel Kurek L      Created: Wed Nov 01, 2012  3:54 PM       repeat ferritin and LFTs in 6 months ------

## 2013-05-02 NOTE — Telephone Encounter (Signed)
Pt has been notified and will have labs this week

## 2013-06-27 ENCOUNTER — Other Ambulatory Visit (INDEPENDENT_AMBULATORY_CARE_PROVIDER_SITE_OTHER): Payer: 59

## 2013-06-27 DIAGNOSIS — K859 Acute pancreatitis without necrosis or infection, unspecified: Secondary | ICD-10-CM

## 2013-06-27 LAB — HEPATIC FUNCTION PANEL
ALK PHOS: 69 U/L (ref 39–117)
ALT: 27 U/L (ref 0–35)
AST: 32 U/L (ref 0–37)
Albumin: 3.8 g/dL (ref 3.5–5.2)
BILIRUBIN DIRECT: 0.1 mg/dL (ref 0.0–0.3)
Total Bilirubin: 0.7 mg/dL (ref 0.2–1.2)
Total Protein: 7.4 g/dL (ref 6.0–8.3)

## 2013-06-27 LAB — FERRITIN: Ferritin: 40.9 ng/mL (ref 10.0–291.0)

## 2014-09-16 ENCOUNTER — Other Ambulatory Visit: Payer: Self-pay | Admitting: *Deleted

## 2014-09-16 ENCOUNTER — Other Ambulatory Visit (INDEPENDENT_AMBULATORY_CARE_PROVIDER_SITE_OTHER): Payer: Self-pay

## 2014-09-16 DIAGNOSIS — K859 Acute pancreatitis, unspecified: Secondary | ICD-10-CM

## 2014-09-16 LAB — HEPATIC FUNCTION PANEL
ALT: 18 U/L (ref 0–35)
AST: 25 U/L (ref 0–37)
Albumin: 3.6 g/dL (ref 3.5–5.2)
Alkaline Phosphatase: 52 U/L (ref 39–117)
BILIRUBIN DIRECT: 0.1 mg/dL (ref 0.0–0.3)
BILIRUBIN TOTAL: 0.5 mg/dL (ref 0.2–1.2)
Total Protein: 6.7 g/dL (ref 6.0–8.3)

## 2014-09-16 LAB — FERRITIN: Ferritin: 16.3 ng/mL (ref 10.0–291.0)

## 2015-03-14 ENCOUNTER — Emergency Department (HOSPITAL_COMMUNITY)
Admission: EM | Admit: 2015-03-14 | Discharge: 2015-03-15 | Disposition: A | Discharge status: Home / Self Care | Payer: Commercial Managed Care - HMO | Attending: Emergency Medicine | Admitting: Emergency Medicine

## 2015-03-14 ENCOUNTER — Emergency Department (HOSPITAL_COMMUNITY): Payer: Commercial Managed Care - HMO

## 2015-03-14 ENCOUNTER — Encounter (HOSPITAL_COMMUNITY): Payer: Self-pay

## 2015-03-14 DIAGNOSIS — R55 Syncope and collapse: Secondary | ICD-10-CM | POA: Insufficient documentation

## 2015-03-14 DIAGNOSIS — Y92002 Bathroom of unspecified non-institutional (private) residence single-family (private) house as the place of occurrence of the external cause: Secondary | ICD-10-CM | POA: Diagnosis not present

## 2015-03-14 DIAGNOSIS — S0003XA Contusion of scalp, initial encounter: Secondary | ICD-10-CM | POA: Insufficient documentation

## 2015-03-14 DIAGNOSIS — I10 Essential (primary) hypertension: Secondary | ICD-10-CM | POA: Insufficient documentation

## 2015-03-14 DIAGNOSIS — S060X1A Concussion with loss of consciousness of 30 minutes or less, initial encounter: Secondary | ICD-10-CM | POA: Insufficient documentation

## 2015-03-14 DIAGNOSIS — W01198A Fall on same level from slipping, tripping and stumbling with subsequent striking against other object, initial encounter: Secondary | ICD-10-CM | POA: Insufficient documentation

## 2015-03-14 DIAGNOSIS — Y998 Other external cause status: Secondary | ICD-10-CM | POA: Diagnosis not present

## 2015-03-14 DIAGNOSIS — S0990XA Unspecified injury of head, initial encounter: Secondary | ICD-10-CM | POA: Diagnosis present

## 2015-03-14 DIAGNOSIS — Z792 Long term (current) use of antibiotics: Secondary | ICD-10-CM | POA: Diagnosis not present

## 2015-03-14 DIAGNOSIS — R1013 Epigastric pain: Secondary | ICD-10-CM | POA: Diagnosis not present

## 2015-03-14 DIAGNOSIS — R61 Generalized hyperhidrosis: Secondary | ICD-10-CM | POA: Diagnosis not present

## 2015-03-14 DIAGNOSIS — Z87442 Personal history of urinary calculi: Secondary | ICD-10-CM | POA: Diagnosis not present

## 2015-03-14 DIAGNOSIS — Y9389 Activity, other specified: Secondary | ICD-10-CM | POA: Insufficient documentation

## 2015-03-14 DIAGNOSIS — Z79899 Other long term (current) drug therapy: Secondary | ICD-10-CM | POA: Insufficient documentation

## 2015-03-14 NOTE — ED Provider Notes (Signed)
CSN: [id number]     Arrival date & time 03/14/15  2224 History  By signing my name below, I, Lauren Forbes, attest that this documentation has been prepared under the direction and in the presence of Lauren Biles, MD. Electronically Signed: Judithann Forbes, ED Scribe. 03/14/2015. 1:48 AM.    Chief Complaint  Patient presents with  . Loss of Consciousness   The history is provided by the patient. No language interpreter was used.   HPI Comments: Lauren Forbes is a 55 y.o. female with a hx of HTN who presents to the Emergency Department brought in by EMS s/p one episode of loss of consciousness that occurred PTA. She reports associated "knot" to her posterior head possibly from hitting her head. Pt explains that she felt a cramping epigastric pain before getting up to walk. She denies feeling as though she would fall as she was walking and the last thing she does not recall anything after the fall. Her husband reports that he did not see what happened but heard pt fall on the hardwood floor in the hallway and he found pt laying there on her back confused and dazed. He adds that pt felt cold and she was diaphoretic. Pt states that she does not recall communicating with her husband immediately after the fall or the EMS ride. She denies any hx of DVT/PE, long distance travel, recent surgeries, or any birth control. She reports a family hx of heart issues with her father. No hx of seizures, brain bleed, aneurysm, or any past similar LOC episodes. She denies any neck pain, CP, SOB, palpitations, blood in stool, or visual disturbances.    Past Medical History  Diagnosis Date  . Hypertension   . Kidney stones    Past Surgical History  Procedure Laterality Date  . Cholecystectomy    . Lithotripsy     History reviewed. No pertinent family history. Social History  Substance Use Topics  . Smoking status: Never Smoker   . Smokeless tobacco: None  . Alcohol Use: Yes   OB History    No data  available     Review of Systems  Constitutional: Positive for diaphoresis.  Eyes: Negative for visual disturbance.  Respiratory: Negative for shortness of breath.   Cardiovascular: Negative for chest pain and palpitations.  Gastrointestinal: Negative for blood in stool.  Musculoskeletal: Negative for neck pain.  Skin:       "knot" to her posterior head    A complete 10 system review of systems was obtained and all systems are negative except as noted in the HPI and PMH.    Allergies  Sea omega  Home Medications   Prior to Admission medications   Medication Sig Start Date End Date Taking? Authorizing Provider  AMOXICILLIN PO Take 1 tablet by mouth 2 (two) times daily.   Yes Historical Provider, MD  EPINEPHrine (EPIPEN) 0.3 mg/0.3 mL DEVI Inject 0.3 mLs (0.3 mg total) into the muscle once. Patient taking differently: Inject 0.3 mg into the muscle daily as needed (allergic reaction).  12/23/10  Yes Melynda Ripple, MD  metoprolol succinate (TOPROL-XL) 25 MG 24 hr tablet Take 25 mg by mouth daily.   Yes Historical Provider, MD  sertraline (ZOLOFT) 50 MG tablet Take 50 mg by mouth daily.   Yes Historical Provider, MD  zolmitriptan (ZOMIG) 5 MG nasal solution Place 1 spray into the nose daily as needed for migraine.   Yes Historical Provider, MD   BP 100/79 mmHg  Pulse 85  Temp(Src) 97.7 F (36.5 C) (Oral)  Resp 20  Ht 5\' 5"  (1.651 m)  Wt 160 lb (72.576 kg)  BMI 26.63 kg/m2  SpO2 96%  LMP 11/22/2010 Physical Exam  Constitutional: She appears well-developed and well-nourished. No distress.  HENT:  Head: Normocephalic and atraumatic.  Mouth/Throat: Oropharynx is clear and moist. No oropharyngeal exudate.  Pt has posterior scalp hematoma, no laceration No c-spine tenderness   Eyes: Conjunctivae and EOM are normal. Right eye exhibits no discharge. Left eye exhibits no discharge. No scleral icterus.  Pupils are 4 and equal, reactive to light   Neck: Normal range of motion. Neck  supple. No JVD present. No thyromegaly present.  Cardiovascular: Normal rate, regular rhythm, normal heart sounds and intact distal pulses.  Exam reveals no gallop and no friction rub.   No murmur heard. Pulmonary/Chest: Effort normal and breath sounds normal. No respiratory distress. She has no wheezes. She has no rales.  Lungs are clear  Abdominal: Soft. Bowel sounds are normal. She exhibits no distension and no mass. There is no tenderness.  Musculoskeletal: Normal range of motion. She exhibits no edema or tenderness.  Grip strength 5/5 Gross sensory exam normal bilaterally   Lymphadenopathy:    She has no cervical adenopathy.  Neurological: She is alert. Coordination normal.  CN 2-12 intact Cerebellar exam shows no extremia  Skin: Skin is warm and dry. No rash noted. No erythema.  Psychiatric: She has a normal mood and affect. Her behavior is normal.  Nursing note and vitals reviewed.   ED Course  Procedures (including critical care time) DIAGNOSTIC STUDIES: Oxygen Saturation is 92% on RA, low by my interpretation.    COORDINATION OF CARE: 11:39 PM- Pt advised of plan for treatment and pt agrees. Pt will receive CT head scan for further evaluation. If imaging is normal, pt advised to follow up with PCP.   1:46 AM - Pt informed of CT results and cardiac monitoring. Pt denies that she can recall the events following the fall. She denies any pain medication for pain to the posterior head. Advised to use ibuprofen at home.    Labs Review Labs Reviewed  BASIC METABOLIC PANEL - Abnormal; Notable for the following:    Glucose, Bld 102 (*)    All other components within normal limits  URINALYSIS, ROUTINE W REFLEX MICROSCOPIC (NOT AT [facility]) - Abnormal; Notable for the following:    APPearance CLOUDY (*)    Leukocytes, UA MODERATE (*)    All other components within normal limits  URINE MICROSCOPIC-ADD ON - Abnormal; Notable for the following:    Squamous Epithelial / LPF 0-5 (*)     Bacteria, UA RARE (*)    Casts HYALINE CASTS (*)    All other components within normal limits  CBC    Imaging Review Ct Head Wo Contrast  03/15/2015  CLINICAL DATA:  55 year old female with fall and an media. EXAM: CT HEAD WITHOUT CONTRAST TECHNIQUE: Contiguous axial images were obtained from the base of the skull through the vertex without intravenous contrast. COMPARISON:  None. FINDINGS: The ventricles and the sulci are appropriate in size for the patient's age. There is no intracranial hemorrhage. No midline shift or mass effect identified. The gray-white matter differentiation is preserved. There is mucoperiosteal thickening and partial opacification of the visualized right maxillary sinus with air-fluid level. There is also partial opacification of the ethmoid air cells. The mastoid air cells are clear. The calvarium is intact. IMPRESSION: No acute intracranial pathology. Paranasal sinus  disease with partial opacification of the right maxillary sinus. Clinical correlation is recommended. Electronically Signed   By: Anner Crete M.D.   On: 03/15/2015 00:15     Lauren Biles, MD has personally reviewed and evaluated these images and lab results as part of his medical decision-making.   EKG Interpretation   Date/Time:  Friday March 14 2015 22:55:20 EST Ventricular Rate:  79 PR Interval:  167 QRS Duration: 95 QT Interval:  389 QTC Calculation: 446 R Axis:   88 Text Interpretation:  Sinus rhythm normal intervals  normal axis    No old  tracing to compare Confirmed by Kathrynn Humble, MD, Thelma Comp [id number]) on 03/14/2015  11:04:17 PM      MDM   Final diagnoses:  Syncope and collapse  Concussion, with loss of consciousness of 30 minutes or less, initial encounter    I personally performed the services described in this documentation, which was scribed in my presence. The recorded information has been reviewed and is accurate.  Pt comes in with cc of possible syncope.  DDx  includes: Orthostatic hypotension Stroke Vertebral artery dissection/stenosis Dysrhythmia PE Vasovagal/neurocardiogenic syncope Aortic stenosis Valvular disorder/Cardiomyopathy Anemia  Pt has no cardiac or neuro risk factors. Husband reports that patient was clammy and pale, and pt reports that she had severe abd pain before she fainted - ? Vasovagal.  The other possibility is that pt has concussion and resultant retrograde amnesia. 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  Neuro, cardio exam are benign. No PE risk factors at the moment.  Pt has no abd pain right now. There is hx of pancreatitis.     Lauren Biles, MD 03/15/15 [phone]

## 2015-03-14 NOTE — ED Notes (Signed)
Per EMS, pt from home, pt got up to go to the bathroom pt had syncopal episode that lasted 1 minute, family heard pt fall and was found in the floor. Pt has pain to the back of the head, dizziness, nausea. Pt was pale and diaphoretic initially. Prior to fall pt only remembers having epigastric pain. Denies epigastric pain now but still nauseous. HR 74, BP 102/72, pt was orthostatic when standing up with ems. CBG 154, RR 16. Pt has 20ga left hand.

## 2015-03-15 LAB — BASIC METABOLIC PANEL
ANION GAP: 13 (ref 5–15)
BUN: 9 mg/dL (ref 6–20)
CHLORIDE: 104 mmol/L (ref 101–111)
CO2: 27 mmol/L (ref 22–32)
Calcium: 9.7 mg/dL (ref 8.9–10.3)
Creatinine, Ser: 0.99 mg/dL (ref 0.44–1.00)
GFR calc Af Amer: >60 mL/min (ref >60)
Glucose, Bld: 102 mg/dL — ABNORMAL HIGH (ref 65–99)
POTASSIUM: 4 mmol/L (ref 3.5–5.1)
SODIUM: 144 mmol/L (ref 135–145)

## 2015-03-15 LAB — CBC
HEMATOCRIT: 42.3 % (ref 36.0–46.0)
HEMOGLOBIN: 14.5 g/dL (ref 12.0–15.0)
MCH: 31.9 pg (ref 26.0–34.0)
MCHC: 34.3 g/dL (ref 30.0–36.0)
MCV: 93.2 fL (ref 78.0–100.0)
Platelets: 275 10*3/uL (ref 150–400)
RBC: 4.54 MIL/uL (ref 3.87–5.11)
RDW: 12.7 % (ref 11.5–15.5)
WBC: 10.3 10*3/uL (ref 4.0–10.5)

## 2015-03-15 LAB — URINE MICROSCOPIC-ADD ON: RBC / HPF: NONE SEEN RBC/hpf (ref 0–5)

## 2015-03-15 LAB — URINALYSIS, ROUTINE W REFLEX MICROSCOPIC
Bilirubin Urine: NEGATIVE
Glucose, UA: NEGATIVE mg/dL
Hgb urine dipstick: NEGATIVE
Ketones, ur: NEGATIVE mg/dL
NITRITE: NEGATIVE
PH: 7 (ref 5.0–8.0)
Protein, ur: NEGATIVE mg/dL
SPECIFIC GRAVITY, URINE: 1.009 (ref 1.005–1.030)

## 2015-03-15 NOTE — Discharge Instructions (Signed)
Concussion, Adult A concussion, or closed-head injury, is a brain injury caused by a direct blow to the head or by a quick and sudden movement (jolt) of the head or neck. Concussions are usually not life-threatening. Even so, the effects of a concussion can be serious. If you have had a concussion before, you are more likely to experience concussion-like symptoms after a direct blow to the head.  CAUSES  Direct blow to the head, such as from running into another player during a soccer game, being hit in a fight, or hitting your head on a hard surface.  A jolt of the head or neck that causes the brain to move back and forth inside the skull, such as in a car crash. SIGNS AND SYMPTOMS The signs of a concussion can be hard to notice. Early on, they may be missed by you, family members, and health care providers. You may look fine but act or feel differently. Symptoms are usually temporary, but they may last for days, weeks, or even longer. Some symptoms may appear right away while others may not show up for hours or days. Every head injury is different. Symptoms include:  Mild to moderate headaches that will not go away.  A feeling of pressure inside your head.  Having more trouble than usual:  Learning or remembering things you have heard.  Answering questions.  Paying attention or concentrating.  Organizing daily tasks.  Making decisions and solving problems.  Slowness in thinking, acting or reacting, speaking, or reading.  Getting lost or being easily confused.  Feeling tired all the time or lacking energy (fatigued).  Feeling drowsy.  Sleep disturbances.  Sleeping more than usual.  Sleeping less than usual.  Trouble falling asleep.  Trouble sleeping (insomnia).  Loss of balance or feeling lightheaded or dizzy.  Nausea or vomiting.  Numbness or tingling.  Increased sensitivity to:  Sounds.  Lights.  Distractions.  Vision problems or eyes that tire  easily.  Diminished sense of taste or smell.  Ringing in the ears.  Mood changes such as feeling sad or anxious.  Becoming easily irritated or angry for little or no reason.  Lack of motivation.  Seeing or hearing things other people do not see or hear (hallucinations). DIAGNOSIS Your health care provider can usually diagnose a concussion based on a description of your injury and symptoms. He or she will ask whether you passed out (lost consciousness) and whether you are having trouble remembering events that happened right before and during your injury. Your evaluation might include:  A brain scan to look for signs of injury to the brain. Even if the test shows no injury, you may still have a concussion.  Blood tests to be sure other problems are not present. TREATMENT  Concussions are usually treated in an emergency department, in urgent care, or at a clinic. You may need to stay in the hospital overnight for further treatment.  Tell your health care provider if you are taking any medicines, including prescription medicines, over-the-counter medicines, and natural remedies. Some medicines, such as blood thinners (anticoagulants) and aspirin, may increase the chance of complications. Also tell your health care provider whether you have had alcohol or are taking illegal drugs. This information may affect treatment.  Your health care provider will send you home with important instructions to follow.  How fast you will recover from a concussion depends on many factors. These factors include how severe your concussion is, what part of your brain was injured,  your age, and how healthy you were before the concussion.  Most people with mild injuries recover fully. Recovery can take time. In general, recovery is slower in older persons. Also, persons who have had a concussion in the past or have other medical problems may find that it takes longer to recover from their current injury. HOME  CARE INSTRUCTIONS General Instructions  Carefully follow the directions your health care provider gave you.  Only take over-the-counter or prescription medicines for pain, discomfort, or fever as directed by your health care provider.  Take only those medicines that your health care provider has approved.  Do not drink alcohol until your health care provider says you are well enough to do so. Alcohol and certain other drugs may slow your recovery and can put you at risk of further injury.  If it is harder than usual to remember things, write them down.  If you are easily distracted, try to do one thing at a time. For example, do not try to watch TV while fixing dinner.  Talk with family members or close friends when making important decisions.  Keep all follow-up appointments. Repeated evaluation of your symptoms is recommended for your recovery.  Watch your symptoms and tell others to do the same. Complications sometimes occur after a concussion. Older adults with a brain injury may have a higher risk of serious complications, such as a blood clot on the brain.  Tell your teachers, school nurse, school counselor, coach, athletic trainer, or work Freight forwarder about your injury, symptoms, and restrictions. Tell them about what you can or cannot do. They should watch for:  Increased problems with attention or concentration.  Increased difficulty remembering or learning new information.  Increased time needed to complete tasks or assignments.  Increased irritability or decreased ability to cope with stress.  Increased symptoms.  Rest. Rest helps the brain to heal. Make sure you:  Get plenty of sleep at night. Avoid staying up late at night.  Keep the same bedtime hours on weekends and weekdays.  Rest during the day. Take daytime naps or rest breaks when you feel tired.  Limit activities that require a lot of thought or concentration. These include:  Doing homework or job-related  work.  Watching TV.  Working on the computer.  Avoid any situation where there is potential for another head injury (football, hockey, soccer, basketball, martial arts, downhill snow sports and horseback riding). Your condition will get worse every time you experience a concussion. You should avoid these activities until you are evaluated by the appropriate follow-up health care providers. Returning To Your Regular Activities You will need to return to your normal activities slowly, not all at once. You must give your body and brain enough time for recovery.  Do not return to sports or other athletic activities until your health care provider tells you it is safe to do so.  Ask your health care provider when you can drive, ride a bicycle, or operate heavy machinery. Your ability to react may be slower after a brain injury. Never do these activities if you are dizzy.  Ask your health care provider about when you can return to work or school. Preventing Another Concussion It is very important to avoid another brain injury, especially before you have recovered. In rare cases, another injury can lead to permanent brain damage, brain swelling, or death. The risk of this is greatest during the first 7-10 days after a head injury. Avoid injuries by:  Wearing a  seat belt when riding in a car.  Drinking alcohol only in moderation.  Wearing a helmet when biking, skiing, skateboarding, skating, or doing similar activities.  Avoiding activities that could lead to a second concussion, such as contact or recreational sports, until your health care provider says it is okay.  Taking safety measures in your home.  Remove clutter and tripping hazards from floors and stairways.  Use grab bars in bathrooms and handrails by stairs.  Place non-slip mats on floors and in bathtubs.  Improve lighting in dim areas. SEEK MEDICAL CARE IF:  You have increased problems paying attention or  concentrating.  You have increased difficulty remembering or learning new information.  You need more time to complete tasks or assignments than before.  You have increased irritability or decreased ability to cope with stress.  You have more symptoms than before. Seek medical care if you have any of the following symptoms for more than 2 weeks after your injury:  Lasting (chronic) headaches.  Dizziness or balance problems.  Nausea.  Vision problems.  Increased sensitivity to noise or light.  Depression or mood swings.  Anxiety or irritability.  Memory problems.  Difficulty concentrating or paying attention.  Sleep problems.  Feeling tired all the time. SEEK IMMEDIATE MEDICAL CARE IF:  You have severe or worsening headaches. These may be a sign of a blood clot in the brain.  You have weakness (even if only in one hand, leg, or part of the face).  You have numbness.  You have decreased coordination.  You vomit repeatedly.  You have increased sleepiness.  One pupil is larger than the other.  You have convulsions.  You have slurred speech.  You have increased confusion. This may be a sign of a blood clot in the brain.  You have increased restlessness, agitation, or irritability.  You are unable to recognize people or places.  You have neck pain.  It is difficult to wake you up.  You have unusual behavior changes.  You lose consciousness. MAKE SURE YOU:  Understand these instructions.  Will watch your condition.  Will get help right away if you are not doing well or get worse.   This information is not intended to replace advice given to you by your health care provider. Make sure you discuss any questions you have with your health care provider.   Document Released: 04/03/2003 Document Revised: 02/01/2014 Document Reviewed: 08/03/2012 Elsevier Interactive Patient Education 2016 [employer].  Syncope Syncope is a medical term for fainting  or passing out. This means you lose consciousness and drop to the ground. People are generally unconscious for less than 5 minutes. You may have some muscle twitches for up to 15 seconds before waking up and returning to normal. Syncope occurs more often in older adults, but it can happen to anyone. While most causes of syncope are not dangerous, syncope can be a sign of a serious medical problem. It is important to seek medical care.  CAUSES  Syncope is caused by a sudden drop in blood flow to the brain. The specific cause is often not determined. Factors that can bring on syncope include:  Taking medicines that lower blood pressure.  Sudden changes in posture, such as standing up quickly.  Taking more medicine than prescribed.  Standing in one place for too long.  Seizure disorders.  Dehydration and excessive exposure to heat.  Low blood sugar (hypoglycemia).  Straining to have a bowel movement.  Heart disease, irregular heartbeat, or  other circulatory problems.  Fear, emotional distress, seeing blood, or severe pain. SYMPTOMS  Right before fainting, you may:  Feel dizzy or light-headed.  Feel nauseous.  See all white or all black in your field of vision.  Have cold, clammy skin. DIAGNOSIS  Your health care provider will ask about your symptoms, perform a physical exam, and perform an electrocardiogram (ECG) to record the electrical activity of your heart. Your health care provider may also perform other heart or blood tests to determine the cause of your syncope which may include:  Transthoracic echocardiogram (TTE). During echocardiography, sound waves are used to evaluate how blood flows through your heart.  Transesophageal echocardiogram (TEE).  Cardiac monitoring. This allows your health care provider to monitor your heart rate and rhythm in real time.  Holter monitor. This is a portable device that records your heartbeat and can help diagnose heart arrhythmias. It  allows your health care provider to track your heart activity for several days, if needed.  Stress tests by exercise or by giving medicine that makes the heart beat faster. TREATMENT  In most cases, no treatment is needed. Depending on the cause of your syncope, your health care provider may recommend changing or stopping some of your medicines. HOME CARE INSTRUCTIONS  Have someone stay with you until you feel stable.  Do not drive, use machinery, or play sports until your health care provider says it is okay.  Keep all follow-up appointments as directed by your health care provider.  Lie down right away if you start feeling like you might faint. Breathe deeply and steadily. Wait until all the symptoms have passed.  Drink enough fluids to keep your urine clear or pale yellow.  If you are taking blood pressure or heart medicine, get up slowly and take several minutes to sit and then stand. This can reduce dizziness. SEEK IMMEDIATE MEDICAL CARE IF:   You have a severe headache.  You have unusual pain in the chest, abdomen, or back.  You are bleeding from your mouth or rectum, or you have black or tarry stool.  You have an irregular or very fast heartbeat.  You have pain with breathing.  You have repeated fainting or seizure-like jerking during an episode.  You faint when sitting or lying down.  You have confusion.  You have trouble walking.  You have severe weakness.  You have vision problems. If you fainted, call your local emergency services (911 in [place] Do not drive yourself to the hospital.    This information is not intended to replace advice given to you by your health care provider. Make sure you discuss any questions you have with your health care provider.   Document Released: 01/11/2005 Document Revised: 05/28/2014 Document Reviewed: 03/12/2011 Elsevier Interactive Patient Education [employer].

## 2016-01-26 HISTORY — PX: COLONOSCOPY: SHX174

## 2016-05-06 ENCOUNTER — Encounter: Payer: Self-pay | Admitting: Nurse Practitioner

## 2016-05-17 ENCOUNTER — Other Ambulatory Visit (INDEPENDENT_AMBULATORY_CARE_PROVIDER_SITE_OTHER): Payer: Commercial Managed Care - HMO

## 2016-05-17 ENCOUNTER — Encounter: Payer: Self-pay | Admitting: Nurse Practitioner

## 2016-05-17 ENCOUNTER — Ambulatory Visit (INDEPENDENT_AMBULATORY_CARE_PROVIDER_SITE_OTHER): Payer: Commercial Managed Care - HMO | Admitting: Nurse Practitioner

## 2016-05-17 VITALS — BP 104/62 | HR 66 | Ht 65.0 in | Wt 154.0 lb

## 2016-05-17 DIAGNOSIS — Z8349 Family history of other endocrine, nutritional and metabolic diseases: Secondary | ICD-10-CM

## 2016-05-17 DIAGNOSIS — R079 Chest pain, unspecified: Secondary | ICD-10-CM

## 2016-05-17 DIAGNOSIS — Z1211 Encounter for screening for malignant neoplasm of colon: Secondary | ICD-10-CM

## 2016-05-17 LAB — HEPATIC FUNCTION PANEL
ALBUMIN: 3.9 g/dL (ref 3.5–5.2)
ALK PHOS: 96 U/L (ref 39–117)
ALT: 15 U/L (ref 0–35)
AST: 22 U/L (ref 0–37)
BILIRUBIN DIRECT: 0.1 mg/dL (ref 0.0–0.3)
BILIRUBIN TOTAL: 0.5 mg/dL (ref 0.2–1.2)
Total Protein: 6.7 g/dL (ref 6.0–8.3)

## 2016-05-17 MED ORDER — NA SULFATE-K SULFATE-MG SULF 17.5-3.13-1.6 GM/177ML PO SOLN: 1.0000 | Freq: Once | ORAL | 0 refills | Status: AC

## 2016-05-17 NOTE — Progress Notes (Signed)
HPI:  Patient is 56 year old female known to Dr. Ardis Hughs but not seen in several years. She has a family history of hemachromatosis in her father and patient is a heterozygote for it herselt. She is here today for evaluation of chest pain. She had the same pain approximate 20 years ago, found to have a hiatal hernia but after gaining  weight pain disappeared. Intentionally lost weight a year ago and pain came back. The pain is behind the xiphoid process. It is a non-radiating, crushing pain which occurs anywhere from once a week to once a month. It is not related to eating or activity. In fact, there are no precipitating or alleviating factors. Episodes asked about 30 minutes. She has associated diaphoresis and nausea. No SOB. She often feels like passing out with the pain. In 2017 she actually did have a syncopal episode, hit her head and was taken to [facility] ED. BMET, CBC were negative. Head CTscan negative. In between these episodes she feels completely fine. Occasionally has acid reflux problems. She has no bowel problems.    Past Medical History:  Diagnosis Date  . Chronic headache   . Hypertension   . Kidney stones   . Pancreatitis   . UTI (urinary tract infection)      Past Surgical History:  Procedure Laterality Date  . CHOLECYSTECTOMY    . LITHOTRIPSY     Family History  Problem Relation Age of Onset  . Colon polyps Mother   . Colon polyps Father   . Colon cancer Maternal Grandmother   . Throat cancer Paternal Grandmother   . Lung cancer Paternal Grandmother    Social History  Substance Use Topics  . Smoking status: Never Smoker  . Smokeless tobacco: Never Used  . Alcohol use Yes   Current Outpatient Prescriptions  Medication Sig Dispense Refill  . EPINEPHrine (EPIPEN) 0.3 mg/0.3 mL DEVI Inject 0.3 mLs (0.3 mg total) into the muscle once. (Patient taking differently: Inject 0.3 mg into the muscle daily as needed (allergic reaction). ) 1 Device 1  . metoprolol  succinate (TOPROL-XL) 25 MG 24 hr tablet Take 25 mg by mouth daily.    . sertraline (ZOLOFT) 50 MG tablet Take 50 mg by mouth daily.    Marland Kitchen zolmitriptan (ZOMIG) 5 MG nasal solution Place 1 spray into the nose daily as needed for migraine.     No current facility-administered medications for this visit.    Allergies  Allergen Reactions  . Sea Omega [Fish Oil]     Seafood      Review of Systems: Positive for allergies, sinus trouble, back pain, headaches, eczema and swelling of feet and legs. All other systems reviewed and negative except where noted in HPI.    Physical Exam: BP 104/62   Pulse 66   Ht 5\' 5"  (1.651 m)   Wt 154 lb (69.9 kg)   LMP 11/22/2010   BMI 25.63 kg/m  Constitutional:  Well-developed white female in no acute distress. Psychiatric: Normal mood and affect. Behavior is normal. HEENT: Pupils equal. Conjunctivae are normal. No scleral icterus. Neck supple.  Cardiovascular: Normal rate, regular rhythm.  Pulmonary/chest: Effort normal and breath sounds normal. No wheezing, rales or rhonchi. Abdominal: Soft, nondistended, nontender. Bowel sounds active throughout. There are no masses palpable. No hepatomegaly. Extremities: no edema Lymphadenopathy: No cervical adenopathy noted. Neurological: Alert and oriented to person place and time. Skin: Skin is warm and dry. No rashes noted.   ASSESSMENT AND PLAN:  1.  56 year old female with intermittent severe crushing subxiphoid pain over the last 1.5 years after intentional weight loss. Pain not exertional, not related to meals or activity. In fact, there are no precipitating nor alleviating factors and she feels completely fine in between episodes.  Episodes accompanied by diaphoresis, nausea and feeling of syncope. Syncopal episode Feb 2017. Seen in ED. Labs and head CT scan negative ( ? vasovagal). Etiology of pain unclear. She had the same pain 20 years ago but it resolved after gaining weight,  now recurred after  intentional weight loss. Doesn't sound like GERD. Could be an esophageal spasm but typical precipitating factors not present.  -For further evaluation will be scheduled for EGD. The risks and benefits of EGD were discussed and the patient agrees to proceed.  -If EGD negative and pain persists she may need esoph manometry  2. Colon cancer screening. Never had a colonoscopy.  -Patient will be scheduled for a screening colonoscopy with possible polypectomy (at a later date).  The risks and benefits of the procedure were discussed and the patient agrees to proceed.   3. heterozygote for hemochromatosis.  - LFTs  Tye Savoy, NP  05/17/2016, 8:45 AM

## 2016-05-17 NOTE — Patient Instructions (Signed)
If you are age 55 or older, your body mass index should be between 23-30. Your Body mass index is 25.63 kg/m. If this is out of the aforementioned range listed, please consider follow up with your Primary Care Provider.  If you are age 67 or younger, your body mass index should be between 19-25. Your Body mass index is 25.63 kg/m. If this is out of the aformentioned range listed, please consider follow up with your Primary Care Provider.   You have been scheduled for an endoscopy and colonoscopy. Please follow the written instructions given to you at your visit today. Please pick up your prep supplies at the pharmacy within the next 1-3 days. If you use inhalers (even only as needed), please bring them with you on the day of your procedure. Your physician has requested that you go to www.startemmi.com and enter the access code given to you at your visit today. This web site gives a general overview about your procedure. However, you should still follow specific instructions given to you by our office regarding your preparation for the procedure.  Your physician has requested that you go to the basement for the following lab work before leaving today: Hepatic Panel  Thank you for choosing me and [facility].  Tye Savoy, NP

## 2016-05-18 NOTE — Progress Notes (Signed)
I agree with the above note, plan 

## 2016-06-16 ENCOUNTER — Encounter: Payer: Self-pay | Admitting: Gastroenterology

## 2016-06-28 ENCOUNTER — Ambulatory Visit (AMBULATORY_SURGERY_CENTER): Payer: Commercial Managed Care - HMO | Admitting: Gastroenterology

## 2016-06-28 VITALS — BP 117/62 | HR 64 | Temp 98.0°F | Resp 13 | Ht 65.0 in | Wt 154.0 lb

## 2016-06-28 DIAGNOSIS — Z1211 Encounter for screening for malignant neoplasm of colon: Secondary | ICD-10-CM | POA: Diagnosis not present

## 2016-06-28 DIAGNOSIS — Z1212 Encounter for screening for malignant neoplasm of rectum: Secondary | ICD-10-CM | POA: Diagnosis not present

## 2016-06-28 DIAGNOSIS — D122 Benign neoplasm of ascending colon: Secondary | ICD-10-CM

## 2016-06-28 DIAGNOSIS — K229 Disease of esophagus, unspecified: Secondary | ICD-10-CM | POA: Diagnosis not present

## 2016-06-28 DIAGNOSIS — K2990 Gastroduodenitis, unspecified, without bleeding: Secondary | ICD-10-CM

## 2016-06-28 DIAGNOSIS — K2970 Gastritis, unspecified, without bleeding: Secondary | ICD-10-CM | POA: Diagnosis not present

## 2016-06-28 DIAGNOSIS — D125 Benign neoplasm of sigmoid colon: Secondary | ICD-10-CM

## 2016-06-28 DIAGNOSIS — Z8349 Family history of other endocrine, nutritional and metabolic diseases: Secondary | ICD-10-CM

## 2016-06-28 DIAGNOSIS — D123 Benign neoplasm of transverse colon: Secondary | ICD-10-CM | POA: Diagnosis not present

## 2016-06-28 DIAGNOSIS — R079 Chest pain, unspecified: Secondary | ICD-10-CM

## 2016-06-28 MED ORDER — SODIUM CHLORIDE 0.9 % IV SOLN
500.0000 mL | INTRAVENOUS | Status: DC
Start: 2016-06-28 — End: 2016-10-18

## 2016-06-28 NOTE — Op Note (Signed)
[place] Patient Name: Lauren Forbes Procedure Date: 06/28/2016 2:09 PM MRN: [id number] Endoscopist: Milus Banister , MD Age: 56 Referring MD:  Date of Birth: 24-Feb-1960 Gender: Female Account #: [id number] Procedure:                Colonoscopy Indications:              Screening for colorectal malignant neoplasm Medicines:                Monitored Anesthesia Care Procedure:                Pre-Anesthesia Assessment:                           - Prior to the procedure, a History and Physical                            was performed, and patient medications and                            allergies were reviewed. The patient's tolerance of                            previous anesthesia was also reviewed. The risks                            and benefits of the procedure and the sedation                            options and risks were discussed with the patient.                            All questions were answered, and informed consent                            was obtained. Prior Anticoagulants: The patient has                            taken no previous anticoagulant or antiplatelet                            agents. ASA Grade Assessment: II - A patient with                            mild systemic disease. After reviewing the risks                            and benefits, the patient was deemed in                            satisfactory condition to undergo the procedure.                           After obtaining informed consent, the colonoscope  was passed under direct vision. Throughout the                            procedure, the patient's blood pressure, pulse, and                            oxygen saturations were monitored continuously. The                            Model PCF-H190DL [id number]) scope was introduced                            through the anus and advanced to the the cecum,                            identified by  appendiceal orifice and ileocecal                            valve. The colonoscopy was performed without                            difficulty. The patient tolerated the procedure                            well. The quality of the bowel preparation was                            good. The ileocecal valve, appendiceal orifice, and                            rectum were photographed. Scope In: 2:18:17 PM Scope Out: 2:34:11 PM Scope Withdrawal Time: 0 hours 11 minutes 11 seconds  Total Procedure Duration: 0 hours 15 minutes 54 seconds  Findings:                 A 15 mm polyp was found in the hepatic flexure. The                            polyp was sessile. The polyp was removed with a hot                            snare. Resection and retrieval were complete.                           Two sessile polyps were found in the sigmoid colon                            and ascending colon. The polyps were 3 to 7 mm in                            size. These polyps were removed with a cold snare.  Resection and retrieval were complete.                           The exam was otherwise without abnormality on                            direct and retroflexion views. Complications:            No immediate complications. Estimated blood loss:                            None. Estimated Blood Loss:     Estimated blood loss: none. Impression:               - One 15 mm polyp at the hepatic flexure, removed                            with a hot snare. Resected and retrieved.                           - Two 3 to 7 mm polyps in the sigmoid colon and in                            the ascending colon, removed with a cold snare.                            Resected and retrieved.                           - The examination was otherwise normal on direct                            and retroflexion views. Recommendation:           - Patient has a contact number available for                             emergencies. The signs and symptoms of potential                            delayed complications were discussed with the                            patient. Return to normal activities tomorrow.                            Written discharge instructions were provided to the                            patient.                           - Resume previous diet.                           - Continue present medications.  You will receive a letter within 2-3 weeks with the                            pathology results and my final recommendations.                           If the polyp(s) is proven to be 'pre-cancerous' on                            pathology, you will need repeat colonoscopy in 3-5                            years. If the polyp(s) is NOT 'precancerous' on                            pathology then you should repeat colon cancer                            screening in 10 years with colonoscopy without need                            for colon cancer screening by any method prior to                            then (including stool testing). Milus Banister, MD 06/28/2016 2:37:06 PM This report has been signed electronically.

## 2016-06-28 NOTE — Op Note (Signed)
[place] Patient Name: Lauren Forbes Procedure Date: 06/28/2016 2:09 PM MRN: [id number] Endoscopist: Milus Banister , MD Age: 56 Referring MD:  Date of Birth: [birth year] Gender: Female Account #: [id number] Procedure:                Upper GI endoscopy Indications:              Unexplained chest pain Medicines:                Monitored Anesthesia Care Procedure:                Pre-Anesthesia Assessment:                           - Prior to the procedure, a History and Physical                            was performed, and patient medications and                            allergies were reviewed. The patient's tolerance of                            previous anesthesia was also reviewed. The risks                            and benefits of the procedure and the sedation                            options and risks were discussed with the patient.                            All questions were answered, and informed consent                            was obtained. Prior Anticoagulants: The patient has                            taken no previous anticoagulant or antiplatelet                            agents. ASA Grade Assessment: II - A patient with                            mild systemic disease. After reviewing the risks                            and benefits, the patient was deemed in                            satisfactory condition to undergo the procedure.                           After obtaining informed consent, the endoscope was  passed under direct vision. Throughout the                            procedure, the patient's blood pressure, pulse, and                            oxygen saturations were monitored continuously. The                            Model GIF-HQ190 [id number]) scope was introduced                            through the mouth, and advanced to the second part                            of duodenum. The upper GI  endoscopy was                            accomplished without difficulty. The patient                            tolerated the procedure well. Scope In: Scope Out: Findings:                 A single fleshy 5 mm mucosal nodule was found at                            the gastroesophageal junction. Biopsies were taken                            with a cold forceps for histology.                           Minimal inflammation characterized by erythema and                            friability was found in the gastric antrum.                            Biopsies were taken with a cold forceps for                            histology.                           The examined duodenum was normal. Complications:            No immediate complications. Estimated blood loss:                            None. Estimated Blood Loss:     Estimated blood loss: none. Impression:               - Mucosal nodule found in the esophagus. Biopsied.                           -  Mild gastritis. Biopsied.                           - Normal examined duodenum. Recommendation:           - Patient has a contact number available for                            emergencies. The signs and symptoms of potential                            delayed complications were discussed with the                            patient. Return to normal activities tomorrow.                            Written discharge instructions were provided to the                            patient.                           - Resume previous diet.                           - Continue present medications.                           - Await pathology results. If the pathology results                            do not explain her intermittent severe low chest                            pain, will continue workup with MRI with MRCP. Milus Banister, MD 06/28/2016 2:47:39 PM This report has been signed electronically.

## 2016-06-28 NOTE — Progress Notes (Signed)
Report given to PACU, vss 

## 2016-06-28 NOTE — Patient Instructions (Signed)
YOU HAD AN ENDOSCOPIC PROCEDURE TODAY AT [facility]:   Refer to the procedure report that was given to you for any specific questions about what was found during the examination.  If the procedure report does not answer your questions, please call your gastroenterologist to clarify.  If you requested that your care partner not be given the details of your procedure findings, then the procedure report has been included in a sealed envelope for you to review at your convenience later.  YOU SHOULD EXPECT: Some feelings of bloating in the abdomen. Passage of more gas than usual.  Walking can help get rid of the air that was put into your GI tract during the procedure and reduce the bloating. If you had a lower endoscopy (such as a colonoscopy or flexible sigmoidoscopy) you may notice spotting of blood in your stool or on the toilet paper. If you underwent a bowel prep for your procedure, you may not have a normal bowel movement for a few days.  Please Note:  You might notice some irritation and congestion in your nose or some drainage.  This is from the oxygen used during your procedure.  There is no need for concern and it should clear up in a day or so.  SYMPTOMS TO REPORT IMMEDIATELY:   Following lower endoscopy (colonoscopy or flexible sigmoidoscopy):  Excessive amounts of blood in the stool  Significant tenderness or worsening of abdominal pains  Swelling of the abdomen that is new, acute  Fever of 100F or higher   Following upper endoscopy (EGD)  Vomiting of blood or coffee ground material  New chest pain or pain under the shoulder blades  Painful or persistently difficult swallowing  New shortness of breath  Fever of 100F or higher  Black, tarry-looking stools  For urgent or emergent issues, a gastroenterologist can be reached at any hour by calling [phone].   DIET:  We do recommend a small meal at first, but then you may proceed to your regular diet.  Drink  plenty of fluids but you should avoid alcoholic beverages for 24 hours.  ACTIVITY:  You should plan to take it easy for the rest of today and you should NOT DRIVE or use heavy machinery until tomorrow (because of the sedation medicines used during the test).    FOLLOW UP: Our staff will call the number listed on your records the next business day following your procedure to check on you and address any questions or concerns that you may have regarding the information given to you following your procedure. If we do not reach you, we will leave a message.  However, if you are feeling well and you are not experiencing any problems, there is no need to return our call.  We will assume that you have returned to your regular daily activities without incident.  If any biopsies were taken you will be contacted by phone or by letter within the next 1-3 weeks.  Please call us at [phone] if you have not heard about the biopsies in 3 weeks. The doctor biopsied her colon to rule out microscopic colitis, and biopsied your stomach to rule out a bacteria called h-pyori.  He also biopsied the area where the esophagus meets the stomach.   SIGNATURES/CONFIDENTIALITY: You and/or your care partner have signed paperwork which will be entered into your electronic medical record.  These signatures attest to the fact that that the information above on your After Visit Summary has been  reviewed and is understood.  Full responsibility of the confidentiality of this discharge information lies with you and/or your care-partner. 

## 2016-06-28 NOTE — Progress Notes (Signed)
Pt's states no medical or surgical changes since previsit or office visit. 

## 2016-06-28 NOTE — Progress Notes (Signed)
Called to room to assist during endoscopic procedure.  Patient ID and intended procedure confirmed with present staff. Received instructions for my participation in the procedure from the performing physician.  

## 2016-06-29 ENCOUNTER — Telehealth: Payer: Self-pay

## 2016-06-29 NOTE — Telephone Encounter (Signed)
  Follow up Call-  Call back number 06/28/2016  Post procedure Call Back phone  # [phone]  Permission to leave phone message Yes  Some recent data might be hidden     Patient questions:  Do you have a fever, pain , or abdominal swelling? No. Pain Score  0 *  Have you tolerated food without any problems? Yes.    Have you been able to return to your normal activities? Yes.    Do you have any questions about your discharge instructions: Diet   No. Medications  No. Follow up visit  No.  Do you have questions or concerns about your Care? No.  Actions: * If pain score is 4 or above: No action needed, pain <4.

## 2016-07-05 ENCOUNTER — Other Ambulatory Visit: Payer: Self-pay

## 2016-07-05 MED ORDER — OMEPRAZOLE 40 MG PO CPDR: 40.0000 mg | DELAYED_RELEASE_CAPSULE | Freq: Every day | ORAL | 11 refills | Status: DC

## 2016-09-06 ENCOUNTER — Telehealth: Payer: Self-pay

## 2016-09-06 NOTE — Telephone Encounter (Signed)
-----   Message from Jeoffrey Massed, RN sent at 07/05/2016 10:44 AM EDT ----- repeat egd in 2 months

## 2016-09-07 ENCOUNTER — Telehealth: Payer: Self-pay | Admitting: Gastroenterology

## 2016-09-07 NOTE — Telephone Encounter (Signed)
Left message on machine to call back  

## 2016-09-08 NOTE — Telephone Encounter (Signed)
The pt has been scheduled for pre visit and EGD.  She will call with any concerns

## 2016-09-16 ENCOUNTER — Encounter: Payer: Self-pay | Admitting: Gastroenterology

## 2016-10-18 ENCOUNTER — Ambulatory Visit (AMBULATORY_SURGERY_CENTER): Payer: Self-pay

## 2016-10-18 VITALS — Ht 66.0 in | Wt 160.2 lb

## 2016-10-18 DIAGNOSIS — K3189 Other diseases of stomach and duodenum: Secondary | ICD-10-CM

## 2016-10-18 NOTE — Progress Notes (Signed)
Recently had propofol No issues  No allergies to eggs or soy No diet meds No past problems with anesthesia No home oxygen  Declined emmi

## 2016-10-20 ENCOUNTER — Encounter: Payer: Self-pay | Admitting: Gastroenterology

## 2016-10-25 ENCOUNTER — Encounter: Payer: Commercial Managed Care - HMO | Admitting: Gastroenterology

## 2016-10-25 ENCOUNTER — Encounter: Payer: Self-pay | Admitting: Gastroenterology

## 2016-10-25 ENCOUNTER — Ambulatory Visit (AMBULATORY_SURGERY_CENTER): Payer: 59 | Admitting: Gastroenterology

## 2016-10-25 VITALS — BP 108/62 | HR 69 | Temp 98.4°F | Resp 14 | Ht 65.0 in | Wt 154.0 lb

## 2016-10-25 DIAGNOSIS — K2970 Gastritis, unspecified, without bleeding: Secondary | ICD-10-CM | POA: Diagnosis not present

## 2016-10-25 DIAGNOSIS — K2990 Gastroduodenitis, unspecified, without bleeding: Secondary | ICD-10-CM | POA: Diagnosis not present

## 2016-10-25 DIAGNOSIS — K317 Polyp of stomach and duodenum: Secondary | ICD-10-CM | POA: Diagnosis present

## 2016-10-25 MED ORDER — SODIUM CHLORIDE 0.9 % IV SOLN: 500.0000 mL | INTRAVENOUS | Status: DC

## 2016-10-25 NOTE — Op Note (Signed)
[place] Patient Name: Lauren Forbes Procedure Date: 10/25/2016 1:49 PM MRN: [id number] Endoscopist: Milus Banister , MD Age: 56 Referring MD:  Date of Birth: 28-Aug-1960 Gender: Female Account #: [id number] Procedure:                Upper GI endoscopy Indications:              Abnormal GE junction nodule 06/2016: fleshy, path                            not neoplastic, here for repeat evaluation Medicines:                Monitored Anesthesia Care Procedure:                Pre-Anesthesia Assessment:                           - Prior to the procedure, a History and Physical                            was performed, and patient medications and                            allergies were reviewed. The patient's tolerance of                            previous anesthesia was also reviewed. The risks                            and benefits of the procedure and the sedation                            options and risks were discussed with the patient.                            All questions were answered, and informed consent                            was obtained. Prior Anticoagulants: The patient has                            taken no previous anticoagulant or antiplatelet                            agents. ASA Grade Assessment: II - A patient with                            mild systemic disease. After reviewing the risks                            and benefits, the patient was deemed in                            satisfactory condition to undergo the procedure.  After obtaining informed consent, the endoscope was                            passed under direct vision. Throughout the                            procedure, the patient's blood pressure, pulse, and                            oxygen saturations were monitored continuously. The                            Endoscope was introduced through the mouth, and                            advanced to the  second part of duodenum. The upper                            GI endoscopy was accomplished without difficulty.                            The patient tolerated the procedure well. Scope In: Scope Out: Findings:                 Minimal inflammation characterized by granularity                            was found in the gastric antrum.                           The exam was otherwise without abnormality. Complications:            No immediate complications. Estimated blood loss:                            None. Estimated Blood Loss:     Estimated blood loss: none. Impression:               - Mild gastritis, previously biopsied (no H.                            pylori).                           - The examination was otherwise normal. Recommendation:           - Patient has a contact number available for                            emergencies. The signs and symptoms of potential                            delayed complications were discussed with the                            patient. Return to normal activities tomorrow.  Written discharge instructions were provided to the                            patient.                           - Resume previous diet.                           - Continue present medications. Continue once daily                            omeprazole 40mg  pill, best taken 20-30 min prior to                            breakfast meal.                           - No repeat upper endoscopy. Milus Banister, MD 10/25/2016 2:02:14 PM This report has been signed electronically.

## 2016-10-25 NOTE — Progress Notes (Signed)
Report to PACU, RN, vss, BBS= Clear.  

## 2016-10-25 NOTE — Patient Instructions (Signed)
YOU HAD AN ENDOSCOPIC PROCEDURE TODAY AT THE [facility]:   Refer to the procedure report that was given to you for any specific questions about what was found during the examination.  If the procedure report does not answer your questions, please call your gastroenterologist to clarify.  If you requested that your care partner not be given the details of your procedure findings, then the procedure report has been included in a sealed envelope for you to review at your convenience later.  YOU SHOULD EXPECT: Some feelings of bloating in the abdomen. Passage of more gas than usual.  Walking can help get rid of the air that was put into your GI tract during the procedure and reduce the bloating. If you had a lower endoscopy (such as a colonoscopy or flexible sigmoidoscopy) you may notice spotting of blood in your stool or on the toilet paper. If you underwent a bowel prep for your procedure, you may not have a normal bowel movement for a few days.  Please Note:  You might notice some irritation and congestion in your nose or some drainage.  This is from the oxygen used during your procedure.  There is no need for concern and it should clear up in a day or so.  SYMPTOMS TO REPORT IMMEDIATELY:     Following upper endoscopy (EGD)  Vomiting of blood or coffee ground material  New chest pain or pain under the shoulder blades  Painful or persistently difficult swallowing  New shortness of breath  Fever of 100F or higher  Black, tarry-looking stools  For urgent or emergent issues, a gastroenterologist can be reached at any hour by calling [phone].   DIET:  We do recommend a small meal at first, but then you may proceed to your regular diet.  Drink plenty of fluids but you should avoid alcoholic beverages for 24 hours.  ACTIVITY:  You should plan to take it easy for the rest of today and you should NOT DRIVE or use heavy machinery until tomorrow (because of the sedation medicines  used during the test).    FOLLOW UP: Our staff will call the number listed on your records the next business day following your procedure to check on you and address any questions or concerns that you may have regarding the information given to you following your procedure. If we do not reach you, we will leave a message.  However, if you are feeling well and you are not experiencing any problems, there is no need to return our call.  We will assume that you have returned to your regular daily activities without incident.  If any biopsies were taken you will be contacted by phone or by letter within the next 1-3 weeks.  Please call us at [phone] if you have not heard about the biopsies in 3 weeks.    SIGNATURES/CONFIDENTIALITY: You and/or your care partner have signed paperwork which will be entered into your electronic medical record.  These signatures attest to the fact that that the information above on your After Visit Summary has been reviewed and is understood.  Full responsibility of the confidentiality of this discharge information lies with you and/or your care-partner.   Resume medications. 

## 2016-10-26 ENCOUNTER — Telehealth: Payer: Self-pay

## 2016-10-26 NOTE — Telephone Encounter (Signed)
Number identifier, left a message. 

## 2016-10-26 NOTE — Telephone Encounter (Signed)
  Follow up Call-  Call back number 10/25/2016 06/28/2016  Post procedure Call Back phone  # [phone]  Permission to leave phone message Yes Yes  Some recent data might be hidden     Patient questions:  Do you have a fever, pain , or abdominal swelling? No. Pain Score  0 *  Have you tolerated food without any problems? Yes.    Have you been able to return to your normal activities? Yes.    Do you have any questions about your discharge instructions: Diet   No. Medications  No. Follow up visit  No.  Do you have questions or concerns about your Care? No.  Actions: * If pain score is 4 or above: No action needed, pain <4.

## 2017-01-07 ENCOUNTER — Other Ambulatory Visit: Payer: Self-pay

## 2017-01-07 MED ORDER — OMEPRAZOLE 40 MG PO CPDR: 40.0000 mg | DELAYED_RELEASE_CAPSULE | Freq: Every day | ORAL | 3 refills | Status: DC

## 2017-08-02 ENCOUNTER — Emergency Department (HOSPITAL_COMMUNITY): Payer: 59

## 2017-08-02 ENCOUNTER — Emergency Department (HOSPITAL_COMMUNITY)
Admission: EM | Admit: 2017-08-02 | Discharge: 2017-08-02 | Disposition: A | Discharge status: Home / Self Care | Payer: 59 | Attending: Emergency Medicine | Admitting: Emergency Medicine

## 2017-08-02 ENCOUNTER — Encounter (HOSPITAL_COMMUNITY): Payer: Self-pay | Admitting: Emergency Medicine

## 2017-08-02 DIAGNOSIS — N281 Cyst of kidney, acquired: Secondary | ICD-10-CM | POA: Diagnosis not present

## 2017-08-02 DIAGNOSIS — I10 Essential (primary) hypertension: Secondary | ICD-10-CM | POA: Insufficient documentation

## 2017-08-02 DIAGNOSIS — K859 Acute pancreatitis without necrosis or infection, unspecified: Secondary | ICD-10-CM | POA: Insufficient documentation

## 2017-08-02 DIAGNOSIS — R1013 Epigastric pain: Secondary | ICD-10-CM | POA: Diagnosis present

## 2017-08-02 DIAGNOSIS — Z79899 Other long term (current) drug therapy: Secondary | ICD-10-CM | POA: Insufficient documentation

## 2017-08-02 DIAGNOSIS — Z85038 Personal history of other malignant neoplasm of large intestine: Secondary | ICD-10-CM | POA: Diagnosis not present

## 2017-08-02 LAB — CBC
HCT: 42.5 % (ref 36.0–46.0)
Hemoglobin: 14.8 g/dL (ref 12.0–15.0)
MCH: 32.6 pg (ref 26.0–34.0)
MCHC: 34.8 g/dL (ref 30.0–36.0)
MCV: 93.6 fL (ref 78.0–100.0)
Platelets: 228 10*3/uL (ref 150–400)
RBC: 4.54 MIL/uL (ref 3.87–5.11)
RDW: 12.7 % (ref 11.5–15.5)
WBC: 13 10*3/uL — ABNORMAL HIGH (ref 4.0–10.5)

## 2017-08-02 LAB — COMPREHENSIVE METABOLIC PANEL
ALT: 53 U/L — ABNORMAL HIGH (ref 0–44)
AST: 115 U/L — ABNORMAL HIGH (ref 15–41)
Albumin: 3.9 g/dL (ref 3.5–5.0)
Alkaline Phosphatase: 123 U/L (ref 38–126)
Anion gap: 12 (ref 5–15)
BUN: 14 mg/dL (ref 6–20)
CO2: 24 mmol/L (ref 22–32)
Calcium: 9.2 mg/dL (ref 8.9–10.3)
Chloride: 105 mmol/L (ref 98–111)
Creatinine, Ser: 1.08 mg/dL — ABNORMAL HIGH (ref 0.44–1.00)
GFR calc Af Amer: >60 mL/min (ref >60)
GFR calc non Af Amer: 57 mL/min — ABNORMAL LOW (ref >60)
Glucose, Bld: 118 mg/dL — ABNORMAL HIGH (ref 70–99)
Potassium: 3.4 mmol/L — ABNORMAL LOW (ref 3.5–5.1)
Sodium: 141 mmol/L (ref 135–145)
Total Bilirubin: 1.2 mg/dL (ref 0.3–1.2)
Total Protein: 7.4 g/dL (ref 6.5–8.1)

## 2017-08-02 LAB — I-STAT TROPONIN, ED: TROPONIN I, POC: 0 ng/mL (ref 0.00–0.08)

## 2017-08-02 LAB — I-STAT CG4 LACTIC ACID, ED: Lactic Acid, Venous: 2.47 mmol/L (ref 0.5–1.9)

## 2017-08-02 LAB — I-STAT BETA HCG BLOOD, ED (MC, WL, AP ONLY): I-stat hCG, quantitative: <5 m[IU]/mL (ref <5)

## 2017-08-02 LAB — LIPASE, BLOOD: Lipase: 99 U/L — ABNORMAL HIGH (ref 11–51)

## 2017-08-02 MED ORDER — MORPHINE SULFATE (PF) 4 MG/ML IV SOLN
4.0000 mg | Freq: Once | INTRAVENOUS | Status: AC
  Administered 2017-08-02: 4 mg via INTRAVENOUS
  Filled 2017-08-02: qty 1

## 2017-08-02 MED ORDER — SODIUM CHLORIDE 0.9 % IV BOLUS
1000.0000 mL | Freq: Once | INTRAVENOUS | Status: AC
  Administered 2017-08-02: 1000 mL via INTRAVENOUS

## 2017-08-02 MED ORDER — HYDROCODONE-ACETAMINOPHEN 5-325 MG PO TABS: 1.0000 | ORAL_TABLET | Freq: Four times a day (QID) | ORAL | 0 refills | Status: DC | PRN

## 2017-08-02 MED ORDER — IOPAMIDOL (ISOVUE-370) INJECTION 76%
100.0000 mL | Freq: Once | INTRAVENOUS | Status: AC | PRN
  Administered 2017-08-02: 100 mL via INTRAVENOUS

## 2017-08-02 MED ORDER — ONDANSETRON HCL 4 MG PO TABS: 4.0000 mg | ORAL_TABLET | Freq: Three times a day (TID) | ORAL | 0 refills | Status: DC | PRN

## 2017-08-02 MED ORDER — IOPAMIDOL (ISOVUE-370) INJECTION 76%
INTRAVENOUS | Status: AC
  Filled 2017-08-02: qty 100

## 2017-08-02 MED ORDER — ONDANSETRON HCL 4 MG/2ML IJ SOLN
4.0000 mg | Freq: Once | INTRAMUSCULAR | Status: AC
  Administered 2017-08-02: 4 mg via INTRAVENOUS
  Filled 2017-08-02: qty 2

## 2017-08-02 NOTE — ED Notes (Signed)
PA Sophia Caccavaie notified of the patients Lactic result.
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
## 2017-08-02 NOTE — Discharge Instructions (Addendum)
Maintain a liquid diet for the rest of today.  Slowly progress your diet as your symptoms improve. Avoid fatty, greasy, spicy foods.  Avoid all alcohol.  Use Zofran as needed for nausea or vomiting.  Use Tylenol as needed for mild to moderate pain.  Use Norco as needed for severe pain. Make sure you are staying well-hydrated with water. Hold your Toprol for the next 2 days.  Follow-up with your GI doctor for further evaluation and management of your symptoms. Return to the emergency room if you develop fevers, persistent vomiting despite medication, severe abdominal pain, or any new or concerning symptoms.

## 2017-08-02 NOTE — ED Triage Notes (Signed)
Pt c/o abd cramping that radiating into shoulder. Pt reports that she sees [facility] across street and had endoscopy before. Reports she has these spells but go away and this one hasnt stopped in over an hour.

## 2017-08-03 ENCOUNTER — Encounter: Payer: Self-pay | Admitting: Gastroenterology

## 2017-08-03 ENCOUNTER — Telehealth: Payer: Self-pay | Admitting: Gastroenterology

## 2017-08-03 ENCOUNTER — Encounter: Payer: Self-pay | Admitting: Nurse Practitioner

## 2017-08-03 ENCOUNTER — Other Ambulatory Visit: Payer: Self-pay | Admitting: Gastroenterology

## 2017-08-03 NOTE — Telephone Encounter (Signed)
I have scheduled the pt with Tye Savoy on 08/11/17 at 3 pm for hospital follow up early pancreatitis.

## 2017-08-11 ENCOUNTER — Other Ambulatory Visit: Payer: 59

## 2017-08-11 ENCOUNTER — Ambulatory Visit: Payer: 59 | Admitting: Nurse Practitioner

## 2017-08-11 ENCOUNTER — Encounter: Payer: Self-pay | Admitting: Nurse Practitioner

## 2017-08-11 DIAGNOSIS — R1013 Epigastric pain: Secondary | ICD-10-CM | POA: Diagnosis not present

## 2017-08-11 DIAGNOSIS — R748 Abnormal levels of other serum enzymes: Secondary | ICD-10-CM | POA: Diagnosis not present

## 2017-08-11 MED ORDER — HYOSCYAMINE SULFATE 0.125 MG SL SUBL: 0.1250 mg | SUBLINGUAL_TABLET | SUBLINGUAL | 0 refills | Status: DC | PRN

## 2017-08-11 NOTE — Progress Notes (Signed)
IMPRESSION and PLAN:    #63. 57 year old female with severe episodic sub-xiphoid / epigastric pain for ~ 10 years.  Episodes are random, transient and she feels fine in between. EGD unremarkable for the pain in 2018.  Her liver chemistries have always been normal until an ED on 7/9 for prolonged episode.  -Transaminases and lipase both mildly elevated. Despite AST to ALT ratio patient only drinks a couple of ETOH beverages a week. She is s/p remote cholecystectomy. CT scan in ED negative for duct dilation; pancreas appeared normal.   -Maybe additional pancreatobiliary imaging.with MRCP is a next good step. I plan to talk with Dr. Ardis Hughs and get his thoughts on it.   #2.  Hemachromatosis, ferritin hasn't been elevated, liver chemistries always normal except AST mildly elevated at recent ED visit for epigastric pain -check TIBC. Check ferritin, normal in 2016. Has never required phlebotomy.         HPI:    Chief Complaint: epigastric pain   Patient is a 57 yo female who I saw April 2018 for episodic epigastric / subxiphoid pain. She also has hemochromatosis. Ferritin and liver chemistries have always been normal.   Lauren Forbes is here after being seen in ED on 7/9 for recurrent epigastric / subxiphoid pain. She has been having these same exact episodes of severe pain for ~ 10 years. Over last last few years the pain has become more intense. Episodes completely random, may go months without one. There is no relationship to food. Pain usually lasts 15-20 minutes, can be so severe she has syncopized before.  Feels like ribs are caving in when it happens and it hurts through to her back. Tuesday of last week she had an episode lasting an hour. She went to ED but pain started to subside upon arrival. ED not too busy so she went ahead with the visit. She takes a PPI.    EKG -Sinus brady, rate 47, non-specific ST changes Lipase 99, AST 115 / ALT 53. Alk phos, bili normal.  CTA chest / abd - normal  pancreas, benign cyst left liver lobe 2.1 cm x 2.7 cm, gallbladder absent, no bile duct dilation, multiple renal cysts, no stones  Patient told she had pancreatitis. She drinks a couple of drinks on the weekends. Had cholecystectomy age 22, had gallstones.    Review of systems:     No sob,  no urinary sx   Past Medical History:  Diagnosis Date  . Chronic headache   . Hypertension   . Kidney stones   . Pancreatitis   . UTI (urinary tract infection)     Patient's surgical history, family medical history, social history, medications and allergies were all reviewed in Epic   Serum creatinine: 1.08 mg/dL (H) 08/02/17 1013 Estimated creatinine clearance: 59.3 mL/min (A)   Physical Exam:     Ht 5' 5.5" (1.664 m) Comment: height measured without shoes  Wt 159 lb (72.1 kg)   LMP 11/22/2010   BMI 26.06 kg/m   GENERAL:  Pleasant female in NAD PSYCH: : Cooperative, normal affect EENT:  conjunctiva pink, mucous membranes moist, neck supple without masses CARDIAC:  RRR, no murmur heard, no peripheral edema PULM: Normal respiratory effort, lungs CTA bilaterally, no wheezing ABDOMEN:  Nondistended, soft, nontender. No obvious masses, no hepatomegaly,  normal bowel sounds SKIN:  turgor, no lesions seen Musculoskeletal:  Normal muscle tone, normal strength NEURO: Alert and oriented x 3, no focal neurologic deficits   Tye Savoy ,  NP 08/11/2017, 3:21 PM

## 2017-08-11 NOTE — Patient Instructions (Addendum)
If you are age 56 or older, your body mass index should be between 23-30. Your Body mass index is 26.06 kg/m. If this is out of the aforementioned range listed, please consider follow up with your Primary Care Provider.  If you are age 17 or younger, your body mass index should be between 19-25. Your Body mass index is 26.06 kg/m. If this is out of the aformentioned range listed, please consider follow up with your Primary Care Provider.   Your provider has requested that you go to the basement level for lab work before leaving today. Press "B" on the elevator. The lab is located at the first door on the left as you exit the elevator. TIBC  We have sent the following medications to your pharmacy for you to pick up at your convenience: Levsin  Will call you about MRCP.  Thank you for choosing me and [facility].  Tye Savoy, NP

## 2017-08-12 LAB — IRON AND TIBC
Iron Saturation: 33 % (ref 15–55)
Iron: 92 ug/dL (ref 27–159)
Total Iron Binding Capacity: 283 ug/dL (ref 250–450)
UIBC: 191 ug/dL (ref 131–425)

## 2017-08-12 NOTE — Progress Notes (Signed)
Hard to say if she truly had pancreatitis with the minimal elevation in her lipase and no pancreatic inflammation on the CT scan.  MRI with MRCP is a  reasonable next step.  Thanks

## 2017-08-16 ENCOUNTER — Other Ambulatory Visit: Payer: Self-pay

## 2017-08-16 DIAGNOSIS — R109 Unspecified abdominal pain: Secondary | ICD-10-CM

## 2017-08-16 DIAGNOSIS — R748 Abnormal levels of other serum enzymes: Secondary | ICD-10-CM

## 2017-08-22 ENCOUNTER — Ambulatory Visit (HOSPITAL_COMMUNITY)
Admission: RE | Admit: 2017-08-22 | Discharge: 2017-08-22 | Disposition: A | Discharge status: Home / Self Care | Payer: 59 | Source: Ambulatory Visit | Attending: Nurse Practitioner | Admitting: Nurse Practitioner

## 2017-08-22 ENCOUNTER — Other Ambulatory Visit: Payer: Self-pay | Admitting: Nurse Practitioner

## 2017-08-22 DIAGNOSIS — R109 Unspecified abdominal pain: Secondary | ICD-10-CM | POA: Insufficient documentation

## 2017-08-22 DIAGNOSIS — R748 Abnormal levels of other serum enzymes: Secondary | ICD-10-CM

## 2017-08-22 DIAGNOSIS — N281 Cyst of kidney, acquired: Secondary | ICD-10-CM | POA: Diagnosis not present

## 2017-08-22 MED ORDER — GADOBENATE DIMEGLUMINE 529 MG/ML IV SOLN
15.0000 mL | Freq: Once | INTRAVENOUS | Status: AC | PRN
  Administered 2017-08-22: 15 mL via INTRAVENOUS

## 2017-08-27 ENCOUNTER — Other Ambulatory Visit: Payer: Self-pay | Admitting: Gastroenterology

## 2018-07-19 ENCOUNTER — Other Ambulatory Visit: Payer: Self-pay | Admitting: Gastroenterology

## 2018-08-04 ENCOUNTER — Other Ambulatory Visit: Payer: Self-pay

## 2018-08-04 DIAGNOSIS — K862 Cyst of pancreas: Secondary | ICD-10-CM

## 2018-09-09 ENCOUNTER — Other Ambulatory Visit: Payer: BC Managed Care – PPO

## 2018-09-12 ENCOUNTER — Ambulatory Visit
Admission: RE | Admit: 2018-09-12 | Discharge: 2018-09-12 | Disposition: A | Discharge status: Home / Self Care | Payer: BC Managed Care – PPO | Source: Ambulatory Visit | Attending: Gastroenterology | Admitting: Gastroenterology

## 2018-09-12 DIAGNOSIS — K862 Cyst of pancreas: Secondary | ICD-10-CM

## 2018-09-12 MED ORDER — GADOBENATE DIMEGLUMINE 529 MG/ML IV SOLN
14.0000 mL | Freq: Once | INTRAVENOUS | Status: AC | PRN
  Administered 2018-09-12: 14 mL via INTRAVENOUS

## 2018-09-14 ENCOUNTER — Other Ambulatory Visit: Payer: BC Managed Care – PPO

## 2019-02-26 ENCOUNTER — Ambulatory Visit: Payer: BC Managed Care – PPO | Attending: Internal Medicine

## 2019-02-26 DIAGNOSIS — Z20822 Contact with and (suspected) exposure to covid-19: Secondary | ICD-10-CM | POA: Insufficient documentation

## 2019-02-27 LAB — NOVEL CORONAVIRUS, NAA: SARS-CoV-2, NAA: NOT DETECTED

## 2019-05-23 IMAGING — MR MR 3D RECON AT SCANNER
19 of 23 series · 19 of 23 positions shown · IV contrast (multihance)
Comparison: CT scan 04/07/2011

CLINICAL DATA: Epigastric pain with elevated lipase.

EXAM:
MRI ABDOMEN WITHOUT AND WITH CONTRAST (INCLUDING MRCP)
TECHNIQUE: Multiplanar multisequence MR imaging of the abdomen was performed
both before and after the administration of intravenous contrast.
Heavily T2-weighted images of the biliary and pancreatic ducts were
obtained, and three-dimensional MRCP images were rendered by post
processing.
CONTRAST:  15mL MULTIHANCE GADOBENATE DIMEGLUMINE 529 MG/ML IV SOLN

[Series 3: T2 fat-sat · axial · 5.0mm · 0.86mm/px · 1 of 50 slices shown]
[im 1/50]
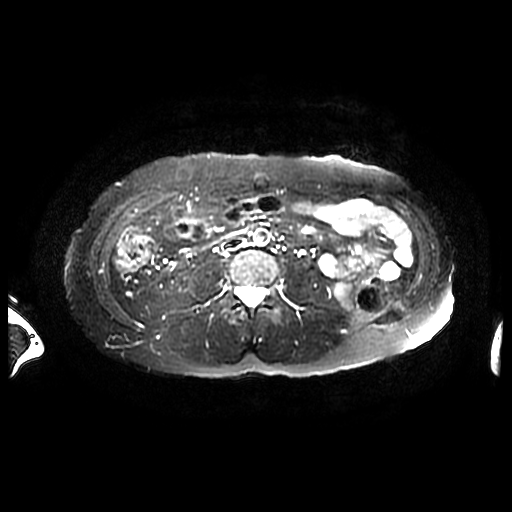

[Series 4: MRCP · coronal · 1.6mm · 0.62mm/px · 1 of 137 slices shown (1 of 2)]
[im 1/137]
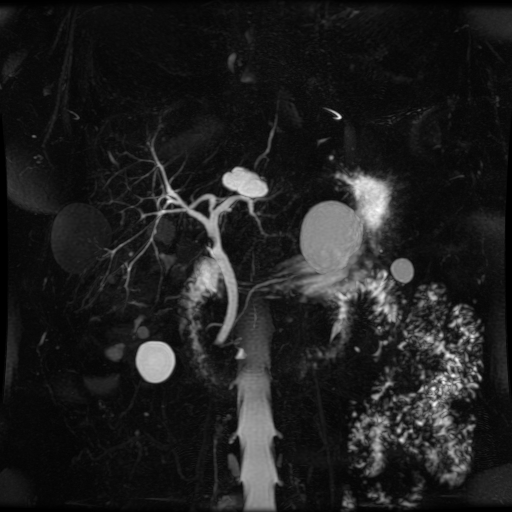

[Series 6: DWI b500 · axial · 6.0mm · 1.76mm/px · 1 of 78 slices shown]
[im 1/78]
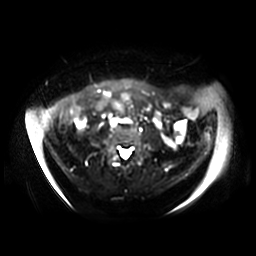

[Series 7: bSSFP fat-sat · coronal · 5.0mm · 0.78mm/px · 1 of 43 slices shown]
[im 1/43]
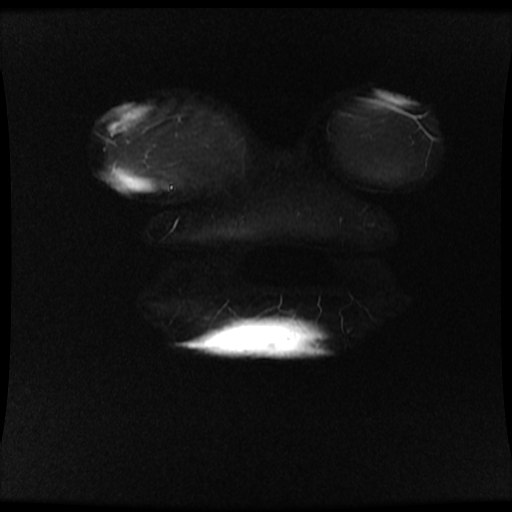

[Series 8: T2 · axial · 5.0mm · 0.86mm/px · 1 of 50 slices shown]
[im 1/50]
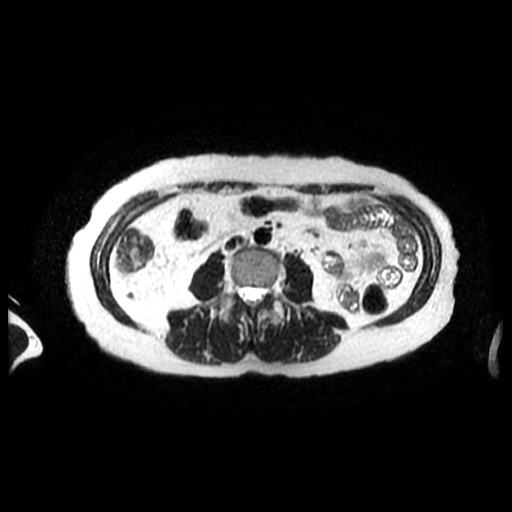

[Series 9: ax dualecho · axial · 5.0mm · 0.86mm/px · 1 of 100 slices shown]
[im 1/100]
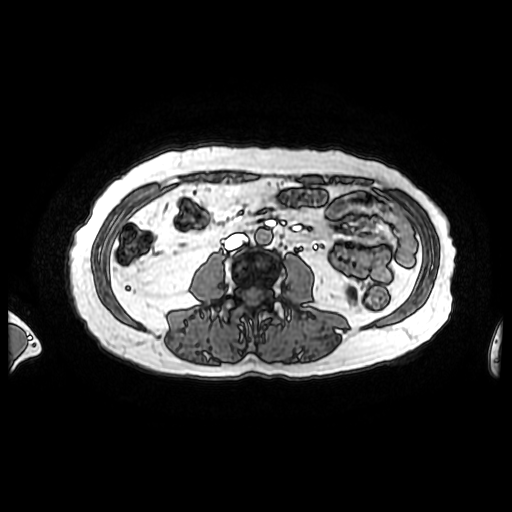

[Series 10: MRCP · coronal · 40.0mm · 0.70mm/px · 1 of 9 slices shown (2 of 2)]
[im 1/9]
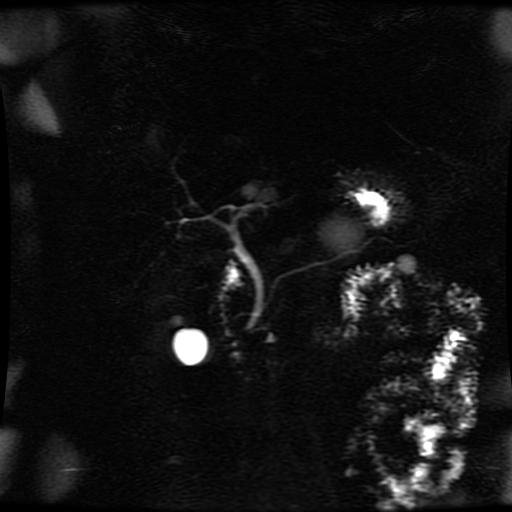

[Series 400: reformatted · axial · 1.6mm · 0.62mm/px · 1 of 80 slices shown (1 of 2)]
[im 1/80]
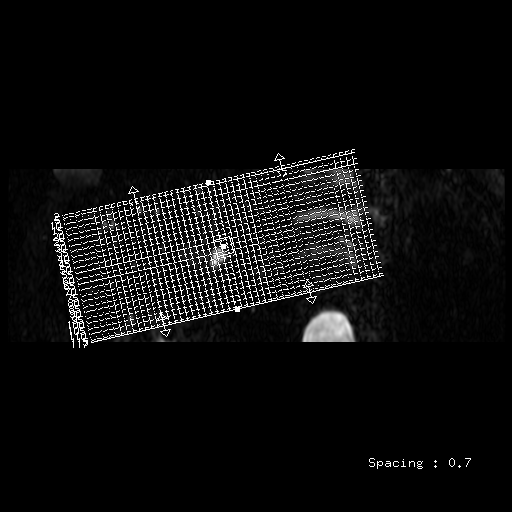

[Series 401: reformatted · axial · 1.6mm · 0.62mm/px · 1 of 134 slices shown (2 of 2)]
[im 1/134]
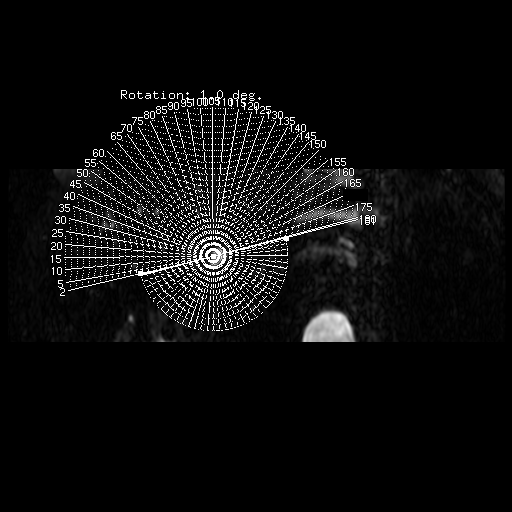

[Series 600: DWI · axial · 6.0mm · 1.76mm/px · 1 of 39 slices shown]
[im 1/39]
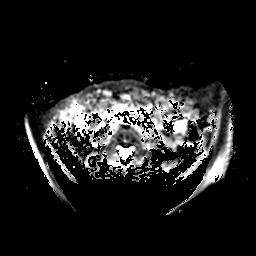

[Series 1100: T1 dynamic · axial · 5.6mm · 0.78mm/px · 1 of 88 slices shown (1 of 5)]
[im 1/88]
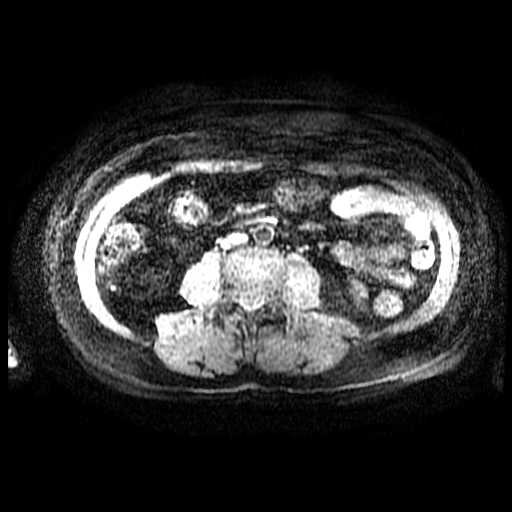

[Series 1101: T1 dynamic · axial · 5.6mm · 0.78mm/px · 1 of 88 slices shown (2 of 5)]
[im 1/88]
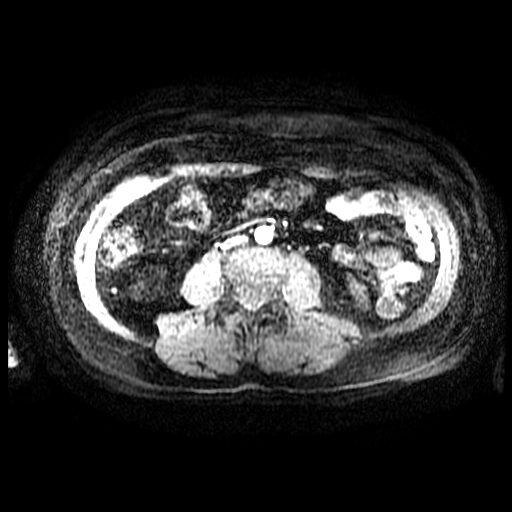

[Series 1103: T1 dynamic · axial · 5.6mm · 0.78mm/px · 1 of 88 slices shown (3 of 5)]
[im 1/88]
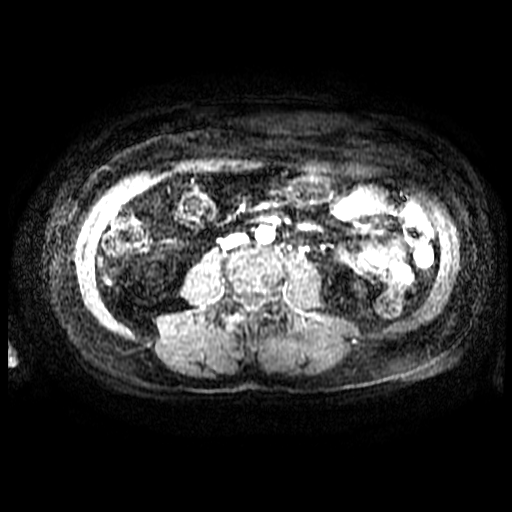

[Series 1104: T1 dynamic · axial · 5.6mm · 0.78mm/px · 1 of 88 slices shown (4 of 5)]
[im 1/88]
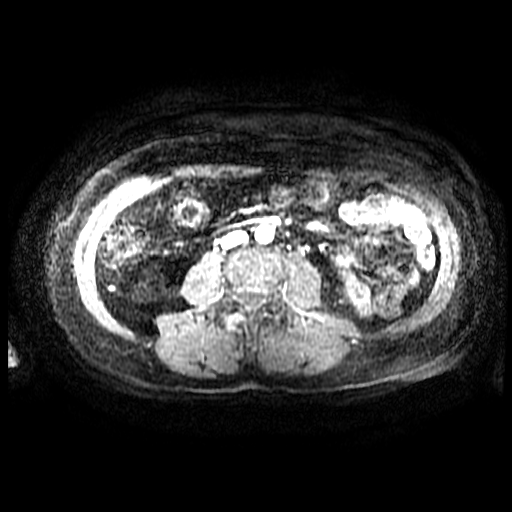

[Series 1105: T1 dynamic · axial · 5.6mm · 0.78mm/px · 1 of 88 slices shown (5 of 5)]
[im 1/88]
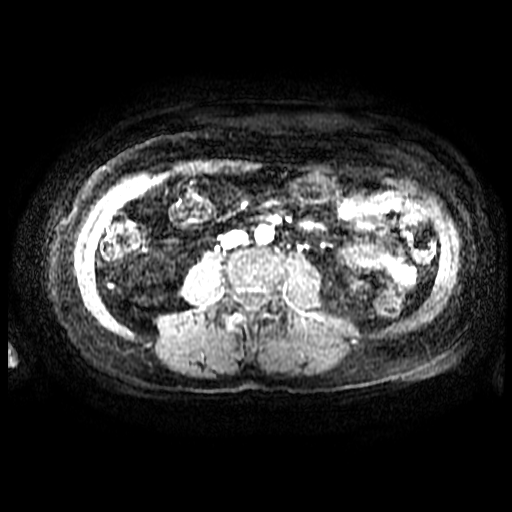

[((id)/(id)/1)-((id)/(id)/1) · axial · 5.6mm · 0.78mm/px · 1 of 88 slices shown (1 of 4)]
[im 1/88]
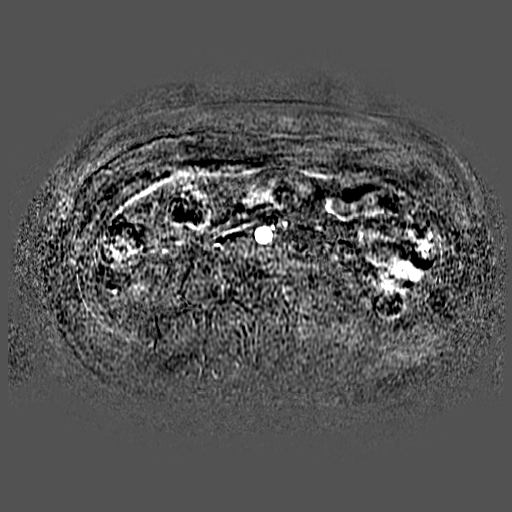

[((id)/(id)/1)-((id)/(id)/1) · axial · 5.6mm · 0.78mm/px · 1 of 88 slices shown (2 of 4)]
[im 1/88]
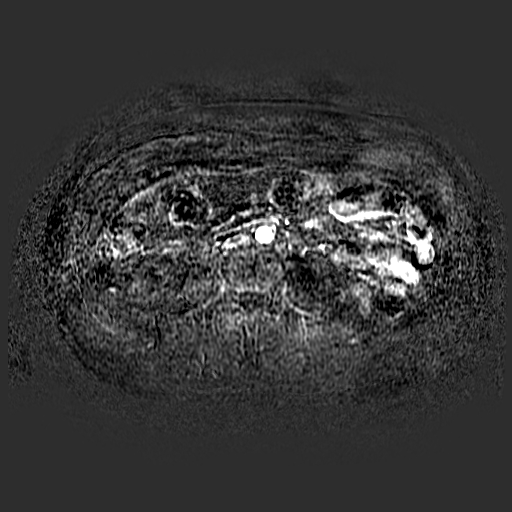

[((id)/(id)/1)-((id)/(id)/1) · axial · 5.6mm · 0.78mm/px · 1 of 88 slices shown (3 of 4)]
[im 1/88]
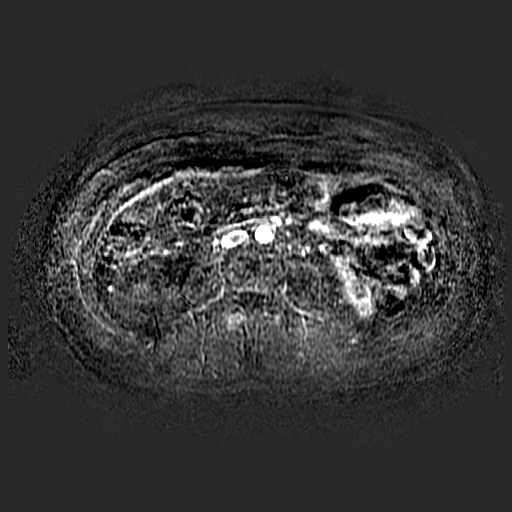

[((id)/(id)/1)-((id)/(id)/1) · axial · 5.6mm · 0.78mm/px · 1 of 88 slices shown (4 of 4)]
[im 1/88]
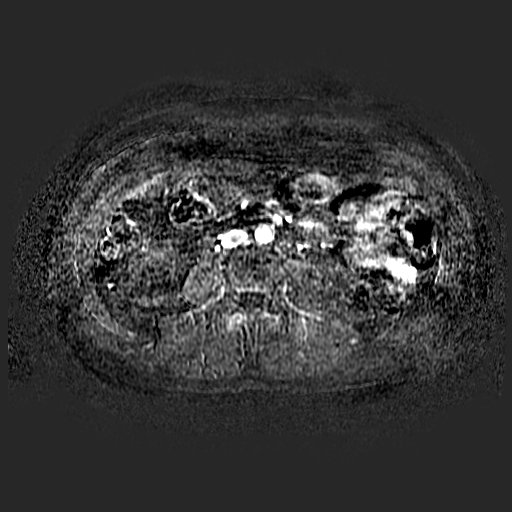

[19 of 23 positions shown; findings below may reference images not displayed]

FINDINGS: Lower chest: Heart size upper normal to mildly increased.

Hepatobiliary: 2.6 cm left hepatic lobe cyst increased from 2.2 cm
on 04/07/2011. Liver parenchyma otherwise unremarkable. Gallbladder
surgically absent. No intrahepatic or extrahepatic biliary dilation.
MRCP images show no filling defect within the biliary ducts to
suggest choledocholithiasis. Extrahepatic common duct measures 6 mm
diameter, within normal limits. Common bile duct in the head of the
pancreas measures 6 mm diameter, within normal limits.

Pancreas: No enhancing pancreatic mass. No dilatation of the main
pancreatic duct. 5 mm cyst in the uncinate process shows no evidence
for internal architecture or enhancement after IV contrast
administration. No peripancreatic edema or inflammation. Pancreatic
parenchyma enhances throughout.

Spleen:  No splenomegaly. No focal mass lesion.

Adrenals/Urinary Tract: No adrenal nodule or mass. Diffuse atrophy
of the left kidney evident with bilateral renal cysts of varying
size and attenuation noted bilaterally. 1 of the more dominant cyst
in the right kidney is exophytic lower pole measuring 2.5 cm with
simple features. 2.1 cm T1 hyperintense T2 intermediate intensity
nonenhancing cyst in the interpolar right kidney is compatible with
a proteinaceous or hemorrhagic cyst (Bosniak II). Additional Bosniak
I and II cysts are noted in the right kidney.

Dominant cyst in the left kidney measures 4.7 cm at the upper pole.
Additional Bosniak I and II cysts are evident in the left kidney.

Stomach/Bowel: Stomach is nondistended. No gastric wall thickening.
No evidence of outlet obstruction. Duodenum is normally positioned
as is the ligament of Treitz. No small bowel or colonic dilatation
within the visualized abdomen.

Vascular/Lymphatic: No abdominal aortic aneurysm. There is no
gastrohepatic or hepatoduodenal ligament lymphadenopathy. No
intraperitoneal or retroperitoneal lymphadenopathy..

Other:  No intraperitoneal free fluid.

Musculoskeletal: No abnormal marrow enhancement within the
visualized bony anatomy
IMPRESSION: 1. 5 mm simple appearing cystic lesion in the uncinate process of
the pancreas, likely benign. This was not visible on previous CT
scans from 0774 and 3121, potentially due to lack of intravenous
contrast on this exams. Follow-up MRI in 12 months recommended to
ensure stability. This recommendation follows ACR consensus
guidelines: Management of Incidental Pancreatic Cysts: A White Paper
of the ACR Incidental Findings Committee. [HOSPITAL]
2853;[DATE].
2. No dilatation of the biliary ducts or main pancreatic duct. No
choledocholithiasis. Diffuse enhancement of the pancreatic
parenchyma without peripancreatic edema/inflammation.
3. Bilateral renal cysts of varying size and signal intensity,
consistent with a combination of Bosniak I and Bosniak II lesions.

## 2019-08-23 ENCOUNTER — Other Ambulatory Visit: Payer: Self-pay | Admitting: Gastroenterology

## 2019-09-14 ENCOUNTER — Encounter: Payer: Self-pay | Admitting: Gastroenterology

## 2019-10-27 ENCOUNTER — Other Ambulatory Visit: Payer: Self-pay | Admitting: Gastroenterology

## 2019-11-06 ENCOUNTER — Ambulatory Visit (AMBULATORY_SURGERY_CENTER): Payer: Self-pay | Admitting: *Deleted

## 2019-11-06 ENCOUNTER — Other Ambulatory Visit: Payer: Self-pay

## 2019-11-06 ENCOUNTER — Encounter: Payer: Self-pay | Admitting: Gastroenterology

## 2019-11-06 VITALS — Ht 65.5 in | Wt 154.0 lb

## 2019-11-06 DIAGNOSIS — Z86010 Personal history of colonic polyps: Secondary | ICD-10-CM

## 2019-11-06 MED ORDER — PLENVU 140 G PO SOLR: 1.0000 | ORAL | 0 refills | Status: DC

## 2019-11-06 NOTE — Progress Notes (Signed)
Patient denies any allergies to egg or soy products. Patient denies complications with anesthesia/sedation.  Patient denies oxygen use at home and denies diet medications. Patient denied information on a colonoscopy procedure. Plenvu coupon given to patient at [facility] appt.  Patient had both covid vaccinations, last one on 04/11/19.

## 2019-11-20 ENCOUNTER — Encounter: Payer: Self-pay | Admitting: Gastroenterology

## 2019-11-20 ENCOUNTER — Other Ambulatory Visit: Payer: Self-pay

## 2019-11-20 ENCOUNTER — Ambulatory Visit (AMBULATORY_SURGERY_CENTER): Payer: BC Managed Care – PPO | Admitting: Gastroenterology

## 2019-11-20 VITALS — BP 115/68 | HR 50 | Temp 97.1°F | Resp 11 | Ht 65.5 in | Wt 154.0 lb

## 2019-11-20 DIAGNOSIS — D123 Benign neoplasm of transverse colon: Secondary | ICD-10-CM | POA: Diagnosis not present

## 2019-11-20 DIAGNOSIS — Z86010 Personal history of colonic polyps: Secondary | ICD-10-CM | POA: Diagnosis not present

## 2019-11-20 MED ORDER — SODIUM CHLORIDE 0.9 % IV SOLN: 500.0000 mL | Freq: Once | INTRAVENOUS | Status: DC

## 2019-11-20 NOTE — Progress Notes (Signed)
PT taken to PACU. Monitors in place. VSS. Report given to RN. 

## 2019-11-20 NOTE — Progress Notes (Signed)
Called to room to assist during endoscopic procedure.  Patient ID and intended procedure confirmed with present staff. Received instructions for my participation in the procedure from the performing physician.  

## 2019-11-20 NOTE — Op Note (Signed)
[facility] Patient Name: Lauren Forbes Procedure Date: 11/20/2019 8:38 AM MRN: [id number] Endoscopist: Milus Banister , MD Age: 59 Referring MD:  Date of Birth: Aug 23, 1960 Gender: Female Account #: [id number] Procedure:                Colonoscopy Indications:              High risk colon cancer surveillance: Personal                            history of colonic polyps; Colonoscopy 2018 three                            TAs, one was 18mm Medicines:                Monitored Anesthesia Care Procedure:                Pre-Anesthesia Assessment:                           - Prior to the procedure, a History and Physical                            was performed, and patient medications and                            allergies were reviewed. The patient's tolerance of                            previous anesthesia was also reviewed. The risks                            and benefits of the procedure and the sedation                            options and risks were discussed with the patient.                            All questions were answered, and informed consent                            was obtained. Prior Anticoagulants: The patient has                            taken no previous anticoagulant or antiplatelet                            agents. ASA Grade Assessment: II - A patient with                            mild systemic disease. After reviewing the risks                            and benefits, the patient was deemed in  satisfactory condition to undergo the procedure.                           After obtaining informed consent, the colonoscope                            was passed under direct vision. Throughout the                            procedure, the patient's blood pressure, pulse, and                            oxygen saturations were monitored continuously. The                            Colonoscope was introduced through the anus  and                            advanced to the the cecum, identified by                            appendiceal orifice and ileocecal valve. The                            colonoscopy was performed without difficulty. The                            patient tolerated the procedure well. The quality                            of the bowel preparation was good. The ileocecal                            valve, appendiceal orifice, and rectum were                            photographed. Scope In: 8:46:55 AM Scope Out: 9:00:25 AM Scope Withdrawal Time: 0 hours 9 minutes 55 seconds  Total Procedure Duration: 0 hours 13 minutes 30 seconds  Findings:                 A 2 mm polyp was found in the transverse colon. The                            polyp was sessile. The polyp was removed with a                            cold snare. Resection and retrieval were complete.                           Multiple small and large-mouthed diverticula were                            found in the left colon.  The exam was otherwise without abnormality on                            direct and retroflexion views. Complications:            No immediate complications. Estimated blood loss:                            None. Estimated Blood Loss:     Estimated blood loss: none. Impression:               - One 2 mm polyp in the transverse colon, removed                            with a cold snare. Resected and retrieved.                           - Diverticulosis in the left colon.                           - The examination was otherwise normal on direct                            and retroflexion views. Recommendation:           - Patient has a contact number available for                            emergencies. The signs and symptoms of potential                            delayed complications were discussed with the                            patient. Return to normal activities tomorrow.                             Written discharge instructions were provided to the                            patient.                           - Resume previous diet.                           - Continue present medications.                           - Await pathology results. Milus Banister, MD 11/20/2019 9:03:02 AM This report has been signed electronically.

## 2019-11-20 NOTE — Progress Notes (Signed)
Pt's states no medical or surgical changes since previsit or office visit.  VS EW

## 2019-11-20 NOTE — Patient Instructions (Signed)
HANDOUTS PROVIDED ON: POLYPS & DIVERTICULOSIS  The polyp removed today have been sent for pathology.  The results can take 1-3 weeks to receive.  When your next colonoscopy should occur will be based on the pathology results.    You may resume your previous diet and medication schedule.  Thank you for allowing [place] to care for you today!!!   YOU HAD AN ENDOSCOPIC PROCEDURE TODAY AT [facility]:   Refer to the procedure report that was given to you for any specific questions about what was found during the examination.  If the procedure report does not answer your questions, please call your gastroenterologist to clarify.  If you requested that your care partner not be given the details of your procedure findings, then the procedure report has been included in a sealed envelope for you to review at your convenience later.  YOU SHOULD EXPECT: Some feelings of bloating in the abdomen. Passage of more gas than usual.  Walking can help get rid of the air that was put into your GI tract during the procedure and reduce the bloating. If you had a lower endoscopy (such as a colonoscopy or flexible sigmoidoscopy) you may notice spotting of blood in your stool or on the toilet paper. If you underwent a bowel prep for your procedure, you may not have a normal bowel movement for a few days.  Please Note:  You might notice some irritation and congestion in your nose or some drainage.  This is from the oxygen used during your procedure.  There is no need for concern and it should clear up in a day or so.  SYMPTOMS TO REPORT IMMEDIATELY:   Following lower endoscopy (colonoscopy or flexible sigmoidoscopy):  Excessive amounts of blood in the stool  Significant tenderness or worsening of abdominal pains  Swelling of the abdomen that is new, acute  Fever of 100F or higher  For urgent or emergent issues, a gastroenterologist can be reached at any hour by calling [phone]. Do not use MyChart  messaging for urgent concerns.    DIET:  We do recommend a small meal at first, but then you may proceed to your regular diet.  Drink plenty of fluids but you should avoid alcoholic beverages for 24 hours.  ACTIVITY:  You should plan to take it easy for the rest of today and you should NOT DRIVE or use heavy machinery until tomorrow (because of the sedation medicines used during the test).    FOLLOW UP: Our staff will call the number listed on your records Thursday between 7:15-8:15am to check on you and address any questions or concerns that you may have regarding the information given to you following your procedure. If we do not reach you, we will leave a message.  We will attempt to reach you two times.  During this call, we will ask if you have developed any symptoms of COVID 19. If you develop any symptoms (ie: fever, flu-like symptoms, shortness of breath, cough etc.) before then, please call [phone].  If you test positive for Covid 19 in the 2 weeks post procedure, please call and report this information to [place].    If any biopsies were taken you will be contacted by phone or by letter within the next 1-3 weeks.  Please call us at [phone] if you have not heard about the biopsies in 3 weeks.    SIGNATURES/CONFIDENTIALITY: You and/or your care partner have signed paperwork which will be entered into  your electronic medical record.  These signatures attest to the fact that that the information above on your After Visit Summary has been reviewed and is understood.  Full responsibility of the confidentiality of this discharge information lies with you and/or your care-partner.

## 2019-11-22 ENCOUNTER — Telehealth: Payer: Self-pay

## 2019-11-22 NOTE — Telephone Encounter (Signed)
  Follow up Call-  Call back number 11/20/2019  Post procedure Call Back phone  # [phone]  Permission to leave phone message Yes  Some recent data might be hidden     Patient questions:  Do you have a fever, pain , or abdominal swelling? No. Pain Score  0 *  Have you tolerated food without any problems? Yes.    Have you been able to return to your normal activities? Yes.    Do you have any questions about your discharge instructions: Diet   No. Medications  No. Follow up visit  No.  Do you have questions or concerns about your Care? No.  Actions: * If pain score is 4 or above: No action needed, pain <4.  1. Have you developed a fever since your procedure? no  2.   Have you had an respiratory symptoms (SOB or cough) since your procedure? no  3.   Have you tested positive for COVID 19 since your procedure no  4.   Have you had any family members/close contacts diagnosed with the COVID 19 since your procedure?  no   If yes to any of these questions please route to Joylene John, RN and Joella Prince, RN

## 2019-11-27 ENCOUNTER — Encounter: Payer: Self-pay | Admitting: Gastroenterology

## 2020-09-12 ENCOUNTER — Telehealth: Payer: Self-pay

## 2020-09-12 DIAGNOSIS — K862 Cyst of pancreas: Secondary | ICD-10-CM

## 2020-09-12 NOTE — Telephone Encounter (Signed)
The pt has been advised that she is due for MR MRCP.  She agrees to have the MR MRCP.  The order has been entered and sent to the schedulers.  I have given to the pt the number to scheduling.  She was told to call if she has not heard from them in 1 week.  The pt has been advised of the information and verbalized understanding.

## 2020-09-12 NOTE — Telephone Encounter (Signed)
-----   Message from Timothy Lasso, RN sent at 09/13/2018 10:05 AM EDT ----- The very small cyst in her pancreas is unchanged.  It is very innocent appearing.  Per 2015 AGA Guidelines on Incidental Pancreatic Cysts she should have a repeat MRI in 2 years

## 2022-01-26 DIAGNOSIS — L400 Psoriasis vulgaris: Secondary | ICD-10-CM | POA: Diagnosis not present

## 2022-02-09 DIAGNOSIS — L400 Psoriasis vulgaris: Secondary | ICD-10-CM | POA: Diagnosis not present

## 2022-03-23 DIAGNOSIS — L400 Psoriasis vulgaris: Secondary | ICD-10-CM | POA: Diagnosis not present

## 2022-03-23 DIAGNOSIS — L408 Other psoriasis: Secondary | ICD-10-CM | POA: Diagnosis not present

## 2022-05-10 DIAGNOSIS — M19072 Primary osteoarthritis, left ankle and foot: Secondary | ICD-10-CM | POA: Diagnosis not present

## 2022-05-17 DIAGNOSIS — Z124 Encounter for screening for malignant neoplasm of cervix: Secondary | ICD-10-CM | POA: Diagnosis not present

## 2022-05-17 DIAGNOSIS — Z0000 Encounter for general adult medical examination without abnormal findings: Secondary | ICD-10-CM | POA: Diagnosis not present

## 2022-05-24 DIAGNOSIS — L400 Psoriasis vulgaris: Secondary | ICD-10-CM | POA: Diagnosis not present

## 2022-05-24 DIAGNOSIS — L408 Other psoriasis: Secondary | ICD-10-CM | POA: Diagnosis not present

## 2022-08-27 DIAGNOSIS — M19072 Primary osteoarthritis, left ankle and foot: Secondary | ICD-10-CM | POA: Diagnosis not present

## 2022-09-12 DIAGNOSIS — U071 COVID-19: Secondary | ICD-10-CM | POA: Diagnosis not present

## 2022-09-12 DIAGNOSIS — Z6825 Body mass index (BMI) 25.0-25.9, adult: Secondary | ICD-10-CM | POA: Diagnosis not present

## 2022-10-01 DIAGNOSIS — M19072 Primary osteoarthritis, left ankle and foot: Secondary | ICD-10-CM | POA: Diagnosis not present

## 2022-10-01 DIAGNOSIS — M21622 Bunionette of left foot: Secondary | ICD-10-CM | POA: Diagnosis not present

## 2022-10-26 DIAGNOSIS — M19072 Primary osteoarthritis, left ankle and foot: Secondary | ICD-10-CM | POA: Diagnosis not present

## 2022-11-08 DIAGNOSIS — L409 Psoriasis, unspecified: Secondary | ICD-10-CM | POA: Diagnosis not present

## 2022-11-08 DIAGNOSIS — L4050 Arthropathic psoriasis, unspecified: Secondary | ICD-10-CM | POA: Diagnosis not present

## 2022-11-08 DIAGNOSIS — R490 Dysphonia: Secondary | ICD-10-CM | POA: Diagnosis not present

## 2022-11-08 DIAGNOSIS — J309 Allergic rhinitis, unspecified: Secondary | ICD-10-CM | POA: Diagnosis not present

## 2022-11-08 DIAGNOSIS — R1310 Dysphagia, unspecified: Secondary | ICD-10-CM | POA: Diagnosis not present

## 2022-11-10 ENCOUNTER — Other Ambulatory Visit: Payer: Self-pay | Admitting: Otolaryngology

## 2022-11-10 DIAGNOSIS — R1310 Dysphagia, unspecified: Secondary | ICD-10-CM

## 2022-11-24 ENCOUNTER — Encounter (HOSPITAL_COMMUNITY): Payer: Self-pay

## 2022-11-24 ENCOUNTER — Emergency Department (HOSPITAL_COMMUNITY)
Admission: EM | Admit: 2022-11-24 | Discharge: 2022-11-24 | Disposition: A | Discharge status: Home / Self Care | Payer: 59 | Attending: Emergency Medicine | Admitting: Emergency Medicine

## 2022-11-24 ENCOUNTER — Other Ambulatory Visit: Payer: Self-pay

## 2022-11-24 ENCOUNTER — Emergency Department (HOSPITAL_COMMUNITY): Payer: 59

## 2022-11-24 DIAGNOSIS — R6889 Other general symptoms and signs: Secondary | ICD-10-CM | POA: Diagnosis not present

## 2022-11-24 DIAGNOSIS — R42 Dizziness and giddiness: Secondary | ICD-10-CM | POA: Diagnosis not present

## 2022-11-24 DIAGNOSIS — L408 Other psoriasis: Secondary | ICD-10-CM | POA: Diagnosis not present

## 2022-11-24 DIAGNOSIS — R1013 Epigastric pain: Secondary | ICD-10-CM | POA: Diagnosis not present

## 2022-11-24 DIAGNOSIS — R55 Syncope and collapse: Secondary | ICD-10-CM | POA: Insufficient documentation

## 2022-11-24 DIAGNOSIS — L400 Psoriasis vulgaris: Secondary | ICD-10-CM | POA: Diagnosis not present

## 2022-11-24 DIAGNOSIS — Z743 Need for continuous supervision: Secondary | ICD-10-CM | POA: Diagnosis not present

## 2022-11-24 DIAGNOSIS — R404 Transient alteration of awareness: Secondary | ICD-10-CM | POA: Diagnosis not present

## 2022-11-24 DIAGNOSIS — E860 Dehydration: Secondary | ICD-10-CM | POA: Diagnosis not present

## 2022-11-24 DIAGNOSIS — R109 Unspecified abdominal pain: Secondary | ICD-10-CM | POA: Diagnosis not present

## 2022-11-24 LAB — CBC WITH DIFFERENTIAL/PLATELET
Abs Immature Granulocytes: 0.04 10*3/uL (ref 0.00–0.07)
Basophils Absolute: 0 10*3/uL (ref 0.0–0.1)
Basophils Relative: 1 %
Eosinophils Absolute: 0.2 10*3/uL (ref 0.0–0.5)
Eosinophils Relative: 3 %
HCT: 30.2 % — ABNORMAL LOW (ref 36.0–46.0)
Hemoglobin: 9 g/dL — ABNORMAL LOW (ref 12.0–15.0)
Immature Granulocytes: 1 %
Lymphocytes Relative: 35 %
Lymphs Abs: 2.5 10*3/uL (ref 0.7–4.0)
MCH: 23.9 pg — ABNORMAL LOW (ref 26.0–34.0)
MCHC: 29.8 g/dL — ABNORMAL LOW (ref 30.0–36.0)
MCV: 80.3 fL (ref 80.0–100.0)
Monocytes Absolute: 0.6 10*3/uL (ref 0.1–1.0)
Monocytes Relative: 8 %
Neutro Abs: 3.8 10*3/uL (ref 1.7–7.7)
Neutrophils Relative %: 52 %
Platelets: 319 10*3/uL (ref 150–400)
RBC: 3.76 MIL/uL — ABNORMAL LOW (ref 3.87–5.11)
RDW: 15.3 % (ref 11.5–15.5)
WBC: 7.1 10*3/uL (ref 4.0–10.5)
nRBC: 0 % (ref 0.0–0.2)

## 2022-11-24 LAB — COMPREHENSIVE METABOLIC PANEL
ALT: 15 U/L (ref 0–44)
AST: 22 U/L (ref 15–41)
Albumin: 3.4 g/dL — ABNORMAL LOW (ref 3.5–5.0)
Alkaline Phosphatase: 71 U/L (ref 38–126)
Anion gap: 8 (ref 5–15)
BUN: 13 mg/dL (ref 6–20)
CO2: 24 mmol/L (ref 22–32)
Calcium: 9.3 mg/dL (ref 8.9–10.3)
Chloride: 110 mmol/L (ref 98–111)
Creatinine, Ser: 1.03 mg/dL — ABNORMAL HIGH (ref 0.44–1.00)
GFR, Estimated: >60 mL/min (ref >60)
Glucose, Bld: 116 mg/dL — ABNORMAL HIGH (ref 70–99)
Potassium: 3.6 mmol/L (ref 3.5–5.1)
Sodium: 142 mmol/L (ref 135–145)
Total Bilirubin: 0.8 mg/dL (ref 0.3–1.2)
Total Protein: 6.7 g/dL (ref 6.5–8.1)

## 2022-11-24 LAB — CBG MONITORING, ED: Glucose-Capillary: 115 mg/dL — ABNORMAL HIGH (ref 70–99)

## 2022-11-24 NOTE — ED Triage Notes (Signed)
Patient brought in by EMS after having a syncopal episode. Per EMS, patient donated blood this morning and has not had any oral intake. Patient was at a med spa appointment when she had 2 syncopal episodes. Pt reports upper abdominal pain. EMS states patient was diaphoretic during syncopal episode and +LOC.

## 2022-11-24 NOTE — Discharge Instructions (Signed)
As discussed, today's evaluations are generally reassuring.  However, with your episodes of passing out a referral has been made to our cardiology colleagues.  In addition, patient to follow-up with your OB/GYN physician to discuss your anemia.  Return here for concerning changes in your condition.

## 2022-11-24 NOTE — ED Notes (Signed)
Fed patient, OK per Dr Jeraldine Loots.

## 2022-11-24 NOTE — ED Provider Notes (Signed)
[facility] EMERGENCY DEPARTMENT AT [facility] Provider Note   CSN: [id number] Arrival date & time: 11/24/22  1220     History  Chief Complaint  Patient presents with   Near Syncope    Lauren Forbes is a 62 y.o. female.  HPI Patient presents via EMS after 2 episodes of syncope.  Patient denies history of cardiac disease  She donated blood today, then went to the med spa.  There she had 1 episode of witnessed syncope and another after EMS arrived.  Each episode was 1 to 2 minutes long, with about 1 minute to return to normal.  Patient may have had some epigastric discomfort, but has no current complaints.  She notes that she did not eat or drink substantially before donating blood earlier today.  EMS reports the patient was awake, alert, mildly hypotensive initially.    Home Medications Prior to Admission medications   Medication Sig Start Date End Date Taking? Authorizing Provider  Apremilast (OTEZLA) 30 MG TABS Take 30 mg by mouth 2 (two) times daily.    [provider]  Clobetasol Propionate 0.05 % shampoo  11/27/18   [provider]  EPINEPHrine (EPIPEN) 0.3 mg/0.3 mL DEVI Inject 0.3 mLs (0.3 mg total) into the muscle once. Patient not taking: Reported on 08/11/2017 12/23/10   Domenick Gong, MD  hyoscyamine (LEVSIN SL) 0.125 MG SL tablet Place 1 tablet (0.125 mg total) under the tongue as needed. As needed for abdominal pain 08/11/17   Meredith Pel, NP  omeprazole (PRILOSEC) 40 MG capsule Take 1 capsule (40 mg total) by mouth daily. 10/29/19   Rachael Fee, MD  oxybutynin (DITROPAN-XL) 10 MG 24 hr tablet Take 10 mg by mouth daily. 11/19/19   [provider]  sertraline (ZOLOFT) 50 MG tablet Take 50 mg by mouth daily.    [provider]  zolmitriptan (ZOMIG) 5 MG nasal solution Place 1 spray into the nose daily as needed for migraine. Patient not taking: Reported on 11/06/2019    [provider]      Allergies     Shellfish allergy    Review of Systems   Review of Systems  Physical Exam Updated Vital Signs BP 117/72   Pulse (!) 57   Temp 98 F (36.7 C) (Oral)   Resp (!) 25   Ht 5' 5.5" (1.664 m)   Wt 69.9 kg   LMP  (LMP Unknown)   SpO2 100%   BMI 25.25 kg/m  Physical Exam Vitals and nursing note reviewed.  Constitutional:      General: She is not in acute distress.    Appearance: She is well-developed.  HENT:     Head: Normocephalic and atraumatic.  Eyes:     Conjunctiva/sclera: Conjunctivae normal.  Cardiovascular:     Rate and Rhythm: Normal rate and regular rhythm.  Pulmonary:     Effort: Pulmonary effort is normal. No respiratory distress.     Breath sounds: Normal breath sounds. No stridor.  Abdominal:     General: There is no distension.  Skin:    General: Skin is warm and dry.  Neurological:     Mental Status: She is alert and oriented to person, place, and time.     Cranial Nerves: No cranial nerve deficit.  Psychiatric:        Mood and Affect: Mood normal.     ED Results / Procedures / Treatments   Labs (all labs ordered are listed, but only abnormal  results are displayed) Labs Reviewed  COMPREHENSIVE METABOLIC PANEL - Abnormal; Notable for the following components:      Result Value   Glucose, Bld 116 (*)    Creatinine, Ser 1.03 (*)    Albumin 3.4 (*)    All other components within normal limits  CBC WITH DIFFERENTIAL/PLATELET - Abnormal; Notable for the following components:   RBC 3.76 (*)    Hemoglobin 9.0 (*)    HCT 30.2 (*)    MCH 23.9 (*)    MCHC 29.8 (*)    All other components within normal limits  CBG MONITORING, ED - Abnormal; Notable for the following components:   Glucose-Capillary 115 (*)    All other components within normal limits    EKG None  Radiology No results found.  Procedures Procedures    Medications Ordered in ED Medications - No data to display  ED Course/ Medical Decision Making/ A&P                                  Medical Decision Making Adult female presents after syncope x 2.  Circumstances including complaint of nature and suggest hypovolemia versus vagal episode.  Given her age, recurrent nature, consideration of dysrhythmia as well.  Patient placed on monitoring, continued received fluids started by EMS.  Cardiac 90 sinus normal Pulse ox 99% room air normal   Amount and/or Complexity of Data Reviewed Independent Historian: EMS Labs: ordered. Decision-making details documented in ED Course. Radiology: ordered and independent interpretation performed. Decision-making details documented in ED Course. ECG/medicine tests: ordered and independent interpretation performed. Decision-making details documented in ED Course.   2:54 PM Patient now accompanied by her daughter.  She has been monitored for hours has had no decompensation.  I have reviewed the patient's labs, and I discussed them with patient and her daughter.  Patient noted to have anemia compared to values of 3 years ago, with features of microcytosis suggesting chronic process, likely exacerbated by today's donation.  No evidence for arrhythmia on monitor, no chest pain consistent with low suspicion for ACS, patient comfortable with discharge.  Patient will follow-up with her cardiology colleagues for consideration of Holter monitor, and with OB who is her primary care team.  Final Clinical Impression(s) / ED Diagnoses Final diagnoses:  Syncope and collapse    Rx / DC Orders ED Discharge Orders          Ordered    Ambulatory referral to Cardiology       Comments: If you have not heard from the Cardiology office within the next 72 hours please call [phone].   11/24/22 1453              Gerhard Munch, MD 11/24/22 1454

## 2022-11-24 NOTE — ED Notes (Signed)
Pt verbalized understanding of discharge instructions. Opportunity for questions provided.  
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
## 2022-11-29 ENCOUNTER — Ambulatory Visit (INDEPENDENT_AMBULATORY_CARE_PROVIDER_SITE_OTHER): Payer: 59 | Admitting: Nurse Practitioner

## 2022-11-29 ENCOUNTER — Encounter: Payer: Self-pay | Admitting: Nurse Practitioner

## 2022-11-29 VITALS — BP 104/72 | HR 68 | Temp 97.9°F | Ht 65.0 in | Wt 156.4 lb

## 2022-11-29 DIAGNOSIS — K2100 Gastro-esophageal reflux disease with esophagitis, without bleeding: Secondary | ICD-10-CM | POA: Diagnosis not present

## 2022-11-29 DIAGNOSIS — R519 Headache, unspecified: Secondary | ICD-10-CM | POA: Insufficient documentation

## 2022-11-29 DIAGNOSIS — L409 Psoriasis, unspecified: Secondary | ICD-10-CM | POA: Diagnosis not present

## 2022-11-29 DIAGNOSIS — F419 Anxiety disorder, unspecified: Secondary | ICD-10-CM | POA: Insufficient documentation

## 2022-11-29 DIAGNOSIS — G8929 Other chronic pain: Secondary | ICD-10-CM | POA: Insufficient documentation

## 2022-11-29 DIAGNOSIS — K219 Gastro-esophageal reflux disease without esophagitis: Secondary | ICD-10-CM | POA: Insufficient documentation

## 2022-11-29 DIAGNOSIS — R7309 Other abnormal glucose: Secondary | ICD-10-CM | POA: Diagnosis not present

## 2022-11-29 DIAGNOSIS — I10 Essential (primary) hypertension: Secondary | ICD-10-CM | POA: Insufficient documentation

## 2022-11-29 NOTE — Patient Instructions (Signed)

## 2022-11-30 LAB — CBC WITH DIFFERENTIAL/PLATELET
Absolute Lymphocytes: 2395 {cells}/uL (ref 850–3900)
Absolute Monocytes: 809 {cells}/uL (ref 200–950)
Basophils Absolute: 46 {cells}/uL (ref 0–200)
Basophils Relative: 0.6 %
Eosinophils Absolute: 285 {cells}/uL (ref 15–500)
Eosinophils Relative: 3.7 %
HCT: 29.8 % — ABNORMAL LOW (ref 35.0–45.0)
Hemoglobin: 9 g/dL — ABNORMAL LOW (ref 11.7–15.5)
MCH: 23.8 pg — ABNORMAL LOW (ref 27.0–33.0)
MCHC: 30.2 g/dL — ABNORMAL LOW (ref 32.0–36.0)
MCV: 78.8 fL — ABNORMAL LOW (ref 80.0–100.0)
MPV: 11.4 fL (ref 7.5–12.5)
Monocytes Relative: 10.5 %
Neutro Abs: 4166 {cells}/uL (ref 1500–7800)
Neutrophils Relative %: 54.1 %
Platelets: 384 10*3/uL (ref 140–400)
RBC: 3.78 10*6/uL — ABNORMAL LOW (ref 3.80–5.10)
RDW: 15.1 % — ABNORMAL HIGH (ref 11.0–15.0)
Total Lymphocyte: 31.1 %
WBC: 7.7 10*3/uL (ref 3.8–10.8)

## 2022-11-30 LAB — COMPLETE METABOLIC PANEL WITH GFR
AG Ratio: 1.3 (calc) (ref 1.0–2.5)
ALT: 10 U/L (ref 6–29)
AST: 19 U/L (ref 10–35)
Albumin: 3.9 g/dL (ref 3.6–5.1)
Alkaline phosphatase (APISO): 86 U/L (ref 37–153)
BUN: 14 mg/dL (ref 7–25)
CO2: 28 mmol/L (ref 20–32)
Calcium: 9.1 mg/dL (ref 8.6–10.4)
Chloride: 107 mmol/L (ref 98–110)
Creat: 1.01 mg/dL (ref 0.50–1.05)
Globulin: 3.1 g/dL (ref 1.9–3.7)
Glucose, Bld: 82 mg/dL (ref 65–139)
Potassium: 4 mmol/L (ref 3.5–5.3)
Sodium: 142 mmol/L (ref 135–146)
Total Bilirubin: 0.3 mg/dL (ref 0.2–1.2)
Total Protein: 7 g/dL (ref 6.1–8.1)
eGFR: 64 mL/min/{1.73_m2} (ref >=60)

## 2022-12-01 ENCOUNTER — Telehealth: Payer: Self-pay | Admitting: Nurse Practitioner

## 2022-12-01 DIAGNOSIS — M79672 Pain in left foot: Secondary | ICD-10-CM | POA: Diagnosis not present

## 2022-12-01 NOTE — Telephone Encounter (Signed)
I wanted to clarify your question. You asked "Please schedule patients CPE in 1 month instead of 3 to recheck labs..." So the CPE she has in February of 2025 you want to be moved to December of 2024?

## 2022-12-01 NOTE — Telephone Encounter (Signed)
Yes that's correct

## 2022-12-02 ENCOUNTER — Ambulatory Visit
Admission: RE | Admit: 2022-12-02 | Discharge: 2022-12-02 | Disposition: A | Discharge status: Home / Self Care | Payer: 59 | Source: Ambulatory Visit | Attending: Otolaryngology | Admitting: Otolaryngology

## 2022-12-02 DIAGNOSIS — R1310 Dysphagia, unspecified: Secondary | ICD-10-CM

## 2022-12-02 DIAGNOSIS — K2289 Other specified disease of esophagus: Secondary | ICD-10-CM | POA: Diagnosis not present

## 2022-12-06 DIAGNOSIS — D509 Iron deficiency anemia, unspecified: Secondary | ICD-10-CM | POA: Diagnosis not present

## 2022-12-06 DIAGNOSIS — M19072 Primary osteoarthritis, left ankle and foot: Secondary | ICD-10-CM | POA: Diagnosis not present

## 2022-12-28 NOTE — Progress Notes (Unsigned)
 Complete Physical  Assessment and Plan:  Jawana was seen today for annual exam.  Diagnoses and all orders for this visit:  Encounter for general adult medical examination with abnormal findings Due Annually  Abnormal glucose Work on decreasing simple carbs and increase exercise -     Hemoglobin A1C w/out eAG  Gastroesophageal reflux disease with esophagitis without hemorrhage -     Magnesium -     hyoscyamine (LEVSIN SL) 0.125 MG SL tablet; Place 1 tablet (0.125 mg total) under the tongue as needed. As needed for abdominal pain  Essential hypertension - continue DASH diet, exercise and monitor at home. Call if greater than 130/80.   -     CBC with Differential/Platelet -     COMPLETE METABOLIC PANEL WITH GFR -     EKG 12-Lead  Psoriasis Continue Taltz and follow with dermatology  Hereditary hemochromatosis (HCC) Gets scheduled blood draws -     CBC with Differential/Platelet -     Iron, TIBC and Ferritin Panel  Psoriatic arthritis (HCC) Continue to follow with rheumatology Monitor symptoms   Screening for ischemic heart disease -     EKG 12-Lead  Screening for hematuria or proteinuria -     Urinalysis, Routine w reflex microscopic -     Microalbumin / creatinine urine ratio  Screening for thyroid disorder -     TSH  Screening for AAA (abdominal aortic aneurysm) -     [place], RETROPERITNL ABD,  LTD  Medication management -     CBC with Differential/Platelet -     COMPLETE METABOLIC PANEL WITH GFR -     Magnesium -     Lipid panel -     TSH -     Hemoglobin A1C w/out eAG -     VITAMIN D 25 Hydroxy (Vit-D Deficiency, Fractures) -     Iron, TIBC and Ferritin Panel -     EKG 12-Lead -     [place], RETROPERITNL ABD,  LTD -     Urinalysis, Routine w reflex microscopic -     Microalbumin / creatinine urine ratio  Screening for lipid disorders -     Lipid panel  Anemia, unspecified type Was checked last after giving blood -     CBC with Differential/Platelet -      Iron, TIBC and Ferritin Panel  Vitamin D deficiency -     VITAMIN D 25 Hydroxy (Vit-D Deficiency, Fractures)  Overweight Long discussion about weight loss, diet, and exercise Recommended diet heavy in fruits and veggies and low in animal meats, cheeses, and dairy products, appropriate calorie intake Patient will work on decreasing saturated fats and simple carbs, increase activity Follow up at next visit  Overactive bladder Continue Ditropan and monitor symptoms  Shellfish allergy -     EPINEPHrine 0.3 mg/0.3 mL IJ SOAJ injection; Inject 0.3 mg into the muscle as needed for anaphylaxis.     Discussed med's effects and SE's. Screening labs and tests as requested with regular follow-up as recommended. Over 40 minutes of exam, counseling, chart review, and complex, high level critical decision making was performed this visit.  Future Appointments  Date Time Provider Department Center  01/10/2023 10:00 AM O'Neal, Ronnald Ramp, MD [facility] None  04/13/2023  8:20 AM Pollyann Savoy, MD [facility] None  05/11/2023 11:20 AM Pollyann Savoy, MD [facility] None  12/29/2023 11:00 AM Raynelle Dick, NP [facility] None    HPI  62 y.o. female  presents for a complete physical and follow  up for has PANCREATITIS; Nausea alone; ABDOMINAL PAIN-RUQ; ABDOMINAL PAIN-LUQ; ABDOMINAL PAIN, EPIGASTRIC; NONSPECIFIC ABNORMAL RESULTS LIVR FUNCTION STUDY; PERSONAL HX COLON CANCER; Anxiety; Chronic headache; GERD (gastroesophageal reflux disease); Essential hypertension; and Abnormal glucose on their problem list..  BP controlled without medication BP Readings from Last 3 Encounters:  12/29/22 122/68  11/29/22 104/72  11/24/22 101/78  She does not workout. She denies chest pain, shortness of breath, dizziness.   Pulse Readings from Last 3 Encounters:  12/29/22 (!) 41  11/29/22 68  11/24/22 72     BMI is Body mass index is 26.39 kg/m., she has not been working on diet and exercise. She does do portion  control.  Wt Readings from Last 3 Encounters:  12/29/22 158 lb 9.6 oz (71.9 kg)  11/29/22 156 lb 6.4 oz (70.9 kg)  11/24/22 154 lb 1.6 oz (69.9 kg)   She has no cholesterol level charted, will check today.   No noted A1c in chart,  will check today  She drinks a lot of water.Last GFR: Lab Results  Component Value Date   EGFR 64 11/29/2022   She does have a history of migraines and uses Zomig as needed. Recently they are infrequent.   She is on Taltz for psoriasis.  Follows with dermatology. She does have perioral psoriasis - occasional difficulty swallowing.  Had swallowing study. Upcoming appointment with rheumatology Dr. Corliss Skains for psoriatic arthritis  She has an upcoming appointment with Dr. Flora Lipps.  She does have a history of anemia, she has hereditary hemochromatosis Lab Results  Component Value Date   WBC 7.7 11/29/2022   HGB 9.0 (L) 11/29/2022   HCT 29.8 (L) 11/29/2022   MCV 78.8 (L) 11/29/2022   PLT 384 11/29/2022   Lab Results  Component Value Date   IRON 92 08/11/2017   TIBC 283 08/11/2017   FERRITIN 16.3 09/16/2014       Patient is on Vitamin D supplement.   No results found for: "VD25OH"    Current Medications:  Current Outpatient Medications on File Prior to Visit  Medication Sig Dispense Refill   Clobetasol Propionate 0.05 % shampoo      EPINEPHrine (EPIPEN) 0.3 mg/0.3 mL DEVI Inject 0.3 mLs (0.3 mg total) into the muscle once. 1 Device 1   fexofenadine (ALLEGRA) 180 MG tablet Take by mouth.     hyoscyamine (LEVSIN SL) 0.125 MG SL tablet Place 1 tablet (0.125 mg total) under the tongue as needed. As needed for abdominal pain 20 tablet 0   Multiple Vitamin (MULTIVITAMIN) tablet Take 1 tablet by mouth daily.     oxybutynin (DITROPAN-XL) 10 MG 24 hr tablet Take 10 mg by mouth daily.     sertraline (ZOLOFT) 50 MG tablet Take 50 mg by mouth daily.     TALTZ 80 MG/ML pen Inject 1 mL every 2 weeks by subcutaneous route for 90 days.     zolmitriptan  (ZOMIG) 5 MG nasal solution Place 1 spray into the nose daily as needed for migraine.     fluticasone (CUTIVATE) 0.05 % cream  (Patient not taking: Reported on 12/29/2022)     meloxicam (MOBIC) 7.5 MG tablet TAKE 1 TABLET BY MOUTH TWICE A DAY FOR 2 WEEKS THEN AS NEEDED (Patient not taking: Reported on 12/29/2022)     omeprazole (PRILOSEC) 40 MG capsule Take 1 capsule (40 mg total) by mouth daily. (Patient not taking: Reported on 12/29/2022) 30 capsule 6   No current facility-administered medications on file prior to visit.   Allergies:  Allergies  Allergen Reactions   Shellfish Allergy Anaphylaxis, Swelling and Other (See Comments)   Medical History:  She has PANCREATITIS; Nausea alone; ABDOMINAL PAIN-RUQ; ABDOMINAL PAIN-LUQ; ABDOMINAL PAIN, EPIGASTRIC; NONSPECIFIC ABNORMAL RESULTS LIVR FUNCTION STUDY; PERSONAL HX COLON CANCER; Anxiety; Chronic headache; GERD (gastroesophageal reflux disease); Essential hypertension; and Abnormal glucose on their problem list. Health Maintenance:   Immunization History  Administered Date(s) Administered   Influenza-Unspecified 09/26/2014, 10/29/2022   Unspecified SARS-COV-2 Vaccination 10/29/2022     Patient Care Team: Annamaria Helling, MD as PCP - General (Obstetrics and Gynecology)  Surgical History:  She has a past surgical history that includes Cholecystectomy; Lithotripsy; Colonoscopy w/ polypectomy; Wisdom tooth extraction; Foot fracture surgery; Colonoscopy (2018); and Leg Surgery (Right). Family History:  Herfamily history includes Colon cancer in her maternal grandmother; Colon polyps in her father and mother; Esophageal cancer in her paternal grandmother; Lung cancer in her paternal grandmother; Pulmonary fibrosis in her father; Throat cancer in her paternal grandmother. Social History:  She reports that she has never smoked. She has never used smokeless tobacco. She reports current alcohol use of about 3.0 standard drinks of alcohol per week. She  reports that she does not use drugs.  Review of Systems: Review of Systems  Constitutional:  Negative for chills and fever.  HENT:  Negative for congestion, hearing loss, sinus pain, sore throat and tinnitus.   Eyes:  Negative for blurred vision and double vision.  Respiratory:  Negative for cough, hemoptysis, sputum production, shortness of breath and wheezing.   Cardiovascular:  Negative for chest pain, palpitations and leg swelling.  Gastrointestinal:  Negative for abdominal pain, constipation, diarrhea, heartburn, nausea and vomiting.       Occasional difficulty swallowing.   Genitourinary:  Negative for dysuria and urgency.  Musculoskeletal:  Positive for back pain and joint pain (left foot). Negative for falls, myalgias and neck pain.  Skin:  Positive for rash (psoriasis).  Neurological:  Negative for dizziness, tingling, tremors, weakness and headaches.  Endo/Heme/Allergies:  Does not bruise/bleed easily.  Psychiatric/Behavioral:  Positive for depression (controlled with Zoloft). Negative for suicidal ideas. The patient is not nervous/anxious and does not have insomnia.     Physical Exam: Estimated body mass index is 26.39 kg/m as calculated from the following:   Height as of this encounter: 5\' 5"  (1.651 m).   Weight as of this encounter: 158 lb 9.6 oz (71.9 kg). BP 122/68   Pulse (!) 41   Temp (!) 97.5 F (36.4 C)   Ht 5\' 5"  (1.651 m)   Wt 158 lb 9.6 oz (71.9 kg)   LMP  (LMP Unknown)   SpO2 97%   BMI 26.39 kg/m  General Appearance: Well nourished, in no apparent distress.  Eyes: PERRLA, EOMs, conjunctiva no swelling or erythema  Sinuses: No Frontal/maxillary tenderness  ENT/Mouth: Ext aud canals clear, normal light reflex with TMs without erythema, bulging. Good dentition. No erythema, swelling, or exudate on post pharynx. Hearing normal.  Neck: Supple, thyroid normal. No bruits  Respiratory: Respiratory effort normal, BS equal bilaterally without rales, rhonchi,  wheezing or stridor.  Cardio: RRR without murmurs, rubs or gallops. Brisk peripheral pulses without edema.  Chest: symmetric, with normal excursions and percussion.  Breasts: Symmetric, without lumps, nipple discharge, retractions.  Abdomen: Soft, nontender, no guarding, rebound, hernias, masses, or organomegaly.  Lymphatics: Non tender without lymphadenopathy.  Genitourinary:  Musculoskeletal: Full ROM all peripheral extremities,5/5 strength, and normal gait.  Skin: Warm, dry without rashes, lesions, ecchymosis. Neuro: Cranial nerves intact, reflexes  equal bilaterally. Normal muscle tone, no cerebellar symptoms. Sensation intact.  Psych: Awake and oriented X 3, normal affect, Insight and Judgment appropriate.   EKG: Sinus bradycardia, no ST changes AORTA SCAN:  < 3 cm  [facility] E  11:05 AM [facility]

## 2022-12-29 ENCOUNTER — Ambulatory Visit (INDEPENDENT_AMBULATORY_CARE_PROVIDER_SITE_OTHER): Payer: 59 | Admitting: Nurse Practitioner

## 2022-12-29 ENCOUNTER — Encounter: Payer: Self-pay | Admitting: Nurse Practitioner

## 2022-12-29 VITALS — BP 122/68 | HR 41 | Temp 97.5°F | Ht 65.0 in | Wt 158.6 lb

## 2022-12-29 DIAGNOSIS — Z136 Encounter for screening for cardiovascular disorders: Secondary | ICD-10-CM | POA: Diagnosis not present

## 2022-12-29 DIAGNOSIS — Z1389 Encounter for screening for other disorder: Secondary | ICD-10-CM | POA: Diagnosis not present

## 2022-12-29 DIAGNOSIS — E559 Vitamin D deficiency, unspecified: Secondary | ICD-10-CM | POA: Diagnosis not present

## 2022-12-29 DIAGNOSIS — I10 Essential (primary) hypertension: Secondary | ICD-10-CM | POA: Diagnosis not present

## 2022-12-29 DIAGNOSIS — I700 Atherosclerosis of aorta: Secondary | ICD-10-CM | POA: Diagnosis not present

## 2022-12-29 DIAGNOSIS — K2100 Gastro-esophageal reflux disease with esophagitis, without bleeding: Secondary | ICD-10-CM | POA: Diagnosis not present

## 2022-12-29 DIAGNOSIS — Z91013 Allergy to seafood: Secondary | ICD-10-CM

## 2022-12-29 DIAGNOSIS — Z1329 Encounter for screening for other suspected endocrine disorder: Secondary | ICD-10-CM | POA: Diagnosis not present

## 2022-12-29 DIAGNOSIS — Z0001 Encounter for general adult medical examination with abnormal findings: Secondary | ICD-10-CM

## 2022-12-29 DIAGNOSIS — L409 Psoriasis, unspecified: Secondary | ICD-10-CM

## 2022-12-29 DIAGNOSIS — D649 Anemia, unspecified: Secondary | ICD-10-CM

## 2022-12-29 DIAGNOSIS — L4050 Arthropathic psoriasis, unspecified: Secondary | ICD-10-CM

## 2022-12-29 DIAGNOSIS — E663 Overweight: Secondary | ICD-10-CM

## 2022-12-29 DIAGNOSIS — Z13220 Encounter for screening for lipoid disorders: Secondary | ICD-10-CM | POA: Diagnosis not present

## 2022-12-29 DIAGNOSIS — N3281 Overactive bladder: Secondary | ICD-10-CM

## 2022-12-29 DIAGNOSIS — R7309 Other abnormal glucose: Secondary | ICD-10-CM

## 2022-12-29 DIAGNOSIS — Z79899 Other long term (current) drug therapy: Secondary | ICD-10-CM

## 2022-12-29 DIAGNOSIS — Z0000 Encounter for general adult medical examination without abnormal findings: Secondary | ICD-10-CM | POA: Diagnosis not present

## 2022-12-29 MED ORDER — HYOSCYAMINE SULFATE 0.125 MG SL SUBL: 0.1250 mg | SUBLINGUAL_TABLET | SUBLINGUAL | 0 refills | Status: AC | PRN

## 2022-12-29 MED ORDER — EPINEPHRINE 0.3 MG/0.3ML IJ SOAJ: 0.3000 mg | INTRAMUSCULAR | 1 refills | Status: AC | PRN

## 2022-12-29 NOTE — Patient Instructions (Signed)

## 2022-12-30 ENCOUNTER — Other Ambulatory Visit: Payer: Self-pay | Admitting: Nurse Practitioner

## 2022-12-30 DIAGNOSIS — R8290 Unspecified abnormal findings in urine: Secondary | ICD-10-CM

## 2022-12-30 LAB — CBC WITH DIFFERENTIAL/PLATELET
Absolute Lymphocytes: 1996 {cells}/uL (ref 850–3900)
Absolute Monocytes: 624 {cells}/uL (ref 200–950)
Basophils Absolute: 52 {cells}/uL (ref 0–200)
Basophils Relative: 0.8 %
Eosinophils Absolute: 143 {cells}/uL (ref 15–500)
Eosinophils Relative: 2.2 %
HCT: 37.4 % (ref 35.0–45.0)
Hemoglobin: 11 g/dL — ABNORMAL LOW (ref 11.7–15.5)
MCH: 24.6 pg — ABNORMAL LOW (ref 27.0–33.0)
MCHC: 29.4 g/dL — ABNORMAL LOW (ref 32.0–36.0)
MCV: 83.7 fL (ref 80.0–100.0)
MPV: 11.4 fL (ref 7.5–12.5)
Monocytes Relative: 9.6 %
Neutro Abs: 3686 {cells}/uL (ref 1500–7800)
Neutrophils Relative %: 56.7 %
Platelets: 364 10*3/uL (ref 140–400)
RBC: 4.47 10*6/uL (ref 3.80–5.10)
RDW: 18.8 % — ABNORMAL HIGH (ref 11.0–15.0)
Total Lymphocyte: 30.7 %
WBC: 6.5 10*3/uL (ref 3.8–10.8)

## 2022-12-30 LAB — COMPLETE METABOLIC PANEL WITH GFR
AG Ratio: 1.2 (calc) (ref 1.0–2.5)
ALT: 12 U/L (ref 6–29)
AST: 18 U/L (ref 10–35)
Albumin: 4.1 g/dL (ref 3.6–5.1)
Alkaline phosphatase (APISO): 93 U/L (ref 37–153)
BUN/Creatinine Ratio: 16 (calc) (ref 6–22)
BUN: 17 mg/dL (ref 7–25)
CO2: 26 mmol/L (ref 20–32)
Calcium: 9.5 mg/dL (ref 8.6–10.4)
Chloride: 108 mmol/L (ref 98–110)
Creat: 1.07 mg/dL — ABNORMAL HIGH (ref 0.50–1.05)
Globulin: 3.3 g/dL (ref 1.9–3.7)
Glucose, Bld: 87 mg/dL (ref 65–99)
Potassium: 4.5 mmol/L (ref 3.5–5.3)
Sodium: 144 mmol/L (ref 135–146)
Total Bilirubin: 0.4 mg/dL (ref 0.2–1.2)
Total Protein: 7.4 g/dL (ref 6.1–8.1)
eGFR: 59 mL/min/{1.73_m2} — ABNORMAL LOW (ref >=60)

## 2022-12-30 LAB — IRON,TIBC AND FERRITIN PANEL
%SAT: 7 % — ABNORMAL LOW (ref 16–45)
Ferritin: 16 ng/mL (ref 16–232)
Iron: 24 ug/dL — ABNORMAL LOW (ref 45–160)
TIBC: 341 ug/dL (ref 250–450)

## 2022-12-30 LAB — LIPID PANEL
Cholesterol: 184 mg/dL (ref <200)
HDL: 65 mg/dL (ref >=50)
LDL Cholesterol (Calc): 103 mg/dL — ABNORMAL HIGH
Non-HDL Cholesterol (Calc): 119 mg/dL (ref <130)
Total CHOL/HDL Ratio: 2.8 (calc) (ref <5.0)
Triglycerides: 70 mg/dL (ref <150)

## 2022-12-30 LAB — URINALYSIS, ROUTINE W REFLEX MICROSCOPIC
Bilirubin Urine: NEGATIVE
Glucose, UA: NEGATIVE
Ketones, ur: NEGATIVE
Nitrite: POSITIVE — AB
Protein, ur: NEGATIVE
RBC / HPF: NONE SEEN /[HPF] (ref 0–2)
Specific Gravity, Urine: 1.015 (ref 1.001–1.035)
WBC, UA: >=60 /[HPF] — AB (ref 0–5)
pH: 6.5 (ref 5.0–8.0)

## 2022-12-30 LAB — MICROSCOPIC MESSAGE

## 2022-12-30 LAB — MICROALBUMIN / CREATININE URINE RATIO
Creatinine, Urine: 89 mg/dL (ref 20–275)
Microalb Creat Ratio: 35 mg/g{creat} — ABNORMAL HIGH (ref <30)
Microalb, Ur: 3.1 mg/dL

## 2022-12-30 LAB — VITAMIN D 25 HYDROXY (VIT D DEFICIENCY, FRACTURES): Vit D, 25-Hydroxy: 45 ng/mL (ref 30–100)

## 2022-12-30 LAB — MAGNESIUM: Magnesium: 2 mg/dL (ref 1.5–2.5)

## 2022-12-30 LAB — TSH: TSH: 1.79 m[IU]/L (ref 0.40–4.50)

## 2022-12-30 LAB — HEMOGLOBIN A1C W/OUT EAG: Hgb A1c MFr Bld: 5 %{Hb} (ref <5.7)

## 2022-12-30 MED ORDER — NITROFURANTOIN MONOHYD MACRO 100 MG PO CAPS: 100.0000 mg | ORAL_CAPSULE | Freq: Two times a day (BID) | ORAL | 0 refills | Status: AC

## 2023-01-01 LAB — URINE CULTURE
MICRO NUMBER:: 15814686
SPECIMEN QUALITY:: ADEQUATE

## 2023-01-03 ENCOUNTER — Other Ambulatory Visit: Payer: Self-pay | Admitting: Nurse Practitioner

## 2023-01-03 DIAGNOSIS — R8290 Unspecified abnormal findings in urine: Secondary | ICD-10-CM

## 2023-01-03 MED ORDER — CEFUROXIME AXETIL 500 MG PO TABS: 500.0000 mg | ORAL_TABLET | Freq: Two times a day (BID) | ORAL | 0 refills | Status: DC

## 2023-01-09 NOTE — Progress Notes (Unsigned)
Cardiology Office Note:  .   Date:  01/10/2023  ID:  [name], DOB [id number], MRN [id number] PCP: Annamaria Helling, MD   [facility] Providers Cardiologist:  Reatha Harps, MD { History of Present Illness: Lauren Forbes is a 62 y.o. female with history of GERD who presents for the evaluation of syncope at the request of Annamaria Helling, MD. Seen in ER 10/30 after syncope after donating blood. HGB was 9.0.    History of Present Illness   Lauren Forbes, a 62 year old female with a history of acid reflux and hemochromatosis, presents for evaluation of syncope. She experienced a syncopal episode approximately two hours after donating blood. She reports feeling dizzy and lightheaded prior to the episode. Her hemoglobin was noted to be low at 9.0, despite a pre-donation finger prick test showing a level of 12.5. She reports BP was low when checked by EMS. No seizure like activity. Reports symptoms resolved within minutes and no further episodes reported. She has no history of high blood pressure, diabetes, heart attack, or stroke. She has had surgeries in the past, including gallbladder and orthopedic procedures. She denies smoking and only drinks alcohol socially. 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 She has two daughters. She takes Ditropan for overactive bladder and Zoloft. She has not had any more syncopal episodes since the incident. She does report occasional dizziness and lightheadedness, which have improved as her blood levels have normalized. She denies any palpitations or chest pain. She does have hemochromatosis and has phlebotomy performed. This in addition to blood donation could be problematic for her.       T chol 184, HDL 65, LDL 103, TG 70 A1c 5.0 TSH 1.79 HGB 11.0      Problem List Hemochromatosis     ROS: All other ROS reviewed and negative. Pertinent positives noted in the HPI.     Studies Reviewed: Marland Kitchen   EKG Interpretation Date/Time:  Monday January 10 2023 09:27:58 EST Ventricular Rate:  55 PR Interval:  152 QRS Duration:  94 QT Interval:  438 QTC Calculation: 419 R Axis:   33  Text Interpretation: Sinus bradycardia with occasional Premature ventricular complexes Confirmed by Lennie Odor [phone]) on 01/10/2023 9:30:35 AM   Physical Exam:   VS:  BP 110/74 (BP Location: Right Arm, Patient Position: Sitting, Cuff Size: Normal)   Pulse (!) 55   Ht 5\' 5"  (1.651 m)   Wt 159 lb 3.2 oz (72.2 kg)   LMP  (LMP Unknown)   SpO2 99%   BMI 26.49 kg/m    Wt Readings from Last 3 Encounters:  01/10/23 159 lb 3.2 oz (72.2 kg)  12/29/22 158 lb 9.6 oz (71.9 kg)  11/29/22 156 lb 6.4 oz (70.9 kg)    GEN: Well nourished, well developed in no acute distress NECK: No JVD; No carotid bruits CARDIAC: RRR, no murmurs, rubs, gallops RESPIRATORY:  Clear to auscultation without rales, wheezing or rhonchi  ABDOMEN: Soft, non-tender, non-distended EXTREMITIES:  No edema; No deformity  ASSESSMENT AND PLAN: .   Assessment and Plan    Syncope Likely vasovagal episode after blood donation in the setting of anemia (Hb 9.0). No recurrent episodes. No driving restrictions. -Order echocardiogram to ensure normal heart structure. -Advise to maintain regular hydration and nutrition. -advised against further blood donation until HGB values normalize.   Anemia Likely secondary to blood donation and hemochromatosis with regular phlebotomy. Hemoglobin improving. -Advise to refrain from further blood donation  until hemoglobin values have normalized. -Continue management with primary care physician.  Premature Ventricular Contractions (PVCs) Asymptomatic. Normal thyroid, magnesium, and potassium levels. -As long as echocardiogram is normal, no treatment required.  Follow-up as needed based on testing results.              Follow-up: Return if symptoms worsen or fail to improve.   Signed, Lenna Gilford. Flora Lipps, MD, [facility]  [facility]  [address] [place], [place] [address] [phone]  9:48 AM

## 2023-01-10 ENCOUNTER — Encounter: Payer: Self-pay | Admitting: Cardiovascular Disease

## 2023-01-10 ENCOUNTER — Ambulatory Visit: Payer: 59 | Attending: Cardiovascular Disease | Admitting: Cardiovascular Disease

## 2023-01-10 VITALS — BP 110/74 | HR 55 | Ht 65.0 in | Wt 159.2 lb

## 2023-01-10 DIAGNOSIS — R55 Syncope and collapse: Secondary | ICD-10-CM | POA: Diagnosis not present

## 2023-01-10 DIAGNOSIS — I493 Ventricular premature depolarization: Secondary | ICD-10-CM | POA: Diagnosis not present

## 2023-01-10 NOTE — Patient Instructions (Addendum)
Medication Instructions:  Your physician recommends that you continue on your current medications as directed. Please refer to the Current Medication list given to you today.    *If you need a refill on your cardiac medications before your next appointment, please call your pharmacy*   Lab Work: None    If you have labs (blood work) drawn today and your tests are completely normal, you will receive your results only by: MyChart Message (if you have MyChart) OR A paper copy in the mail If you have any lab test that is abnormal or we need to change your treatment, we will call you to review the results.   Testing/Procedures: Echo will be scheduled at [address].  Your physician has requested that you have an echocardiogram. Echocardiography is a painless test that uses sound waves to create images of your heart. It provides your doctor with information about the size and shape of your heart and how well your heart's chambers and valves are working. This procedure takes approximately one hour. There are no restrictions for this procedure. Please do NOT wear cologne, perfume, aftershave, or lotions (deodorant is allowed). Please arrive 15 minutes prior to your appointment time.    Follow-Up: At [facility], you and your health needs are our priority.  As part of our continuing mission to provide you with exceptional heart care, we have created designated Provider Care Teams.  These Care Teams include your primary Cardiologist (physician) and Advanced Practice Providers (APPs -  Physician Assistants and Nurse Practitioners) who all work together to provide you with the care you need, when you need it.  We recommend signing up for the patient portal called "MyChart".  Sign up information is provided on this After Visit Summary.  MyChart is used to connect with patients for Virtual Visits (Telemedicine).  Patients are able to view lab/test results, encounter notes, upcoming  appointments, etc.  Non-urgent messages can be sent to your provider as well.   To learn more about what you can do with MyChart, go to [email or web address].    Your next appointment:   Follow up as needed pending results of echo   Provider:   Reatha Harps, MD    Other Instructions

## 2023-01-25 ENCOUNTER — Ambulatory Visit (HOSPITAL_COMMUNITY): Payer: 59 | Attending: Internal Medicine

## 2023-01-25 DIAGNOSIS — R55 Syncope and collapse: Secondary | ICD-10-CM | POA: Diagnosis not present

## 2023-01-25 DIAGNOSIS — I493 Ventricular premature depolarization: Secondary | ICD-10-CM | POA: Diagnosis not present

## 2023-01-25 LAB — ECHOCARDIOGRAM COMPLETE
AV Vena cont: 0.2 cm
Est EF: 50
S' Lateral: 3.64 cm

## 2023-02-22 DIAGNOSIS — M19072 Primary osteoarthritis, left ankle and foot: Secondary | ICD-10-CM | POA: Diagnosis not present

## 2023-03-01 ENCOUNTER — Encounter: Payer: 59 | Admitting: Nurse Practitioner
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
## 2023-04-13 ENCOUNTER — Ambulatory Visit (INDEPENDENT_AMBULATORY_CARE_PROVIDER_SITE_OTHER)

## 2023-04-13 ENCOUNTER — Ambulatory Visit: Payer: BC Managed Care – PPO | Attending: Rheumatology | Admitting: Rheumatology

## 2023-04-13 ENCOUNTER — Ambulatory Visit

## 2023-04-13 ENCOUNTER — Encounter: Payer: Self-pay | Admitting: Rheumatology

## 2023-04-13 VITALS — BP 115/77 | HR 64 | Resp 16 | Ht 65.0 in | Wt 156.6 lb

## 2023-04-13 DIAGNOSIS — G8929 Other chronic pain: Secondary | ICD-10-CM | POA: Diagnosis not present

## 2023-04-13 DIAGNOSIS — M7662 Achilles tendinitis, left leg: Secondary | ICD-10-CM

## 2023-04-13 DIAGNOSIS — G43E09 Chronic migraine with aura, not intractable, without status migrainosus: Secondary | ICD-10-CM

## 2023-04-13 DIAGNOSIS — M79671 Pain in right foot: Secondary | ICD-10-CM | POA: Diagnosis not present

## 2023-04-13 DIAGNOSIS — R5383 Other fatigue: Secondary | ICD-10-CM

## 2023-04-13 DIAGNOSIS — Z8659 Personal history of other mental and behavioral disorders: Secondary | ICD-10-CM

## 2023-04-13 DIAGNOSIS — M25561 Pain in right knee: Secondary | ICD-10-CM | POA: Diagnosis not present

## 2023-04-13 DIAGNOSIS — J302 Other seasonal allergic rhinitis: Secondary | ICD-10-CM

## 2023-04-13 DIAGNOSIS — Z840 Family history of diseases of the skin and subcutaneous tissue: Secondary | ICD-10-CM

## 2023-04-13 DIAGNOSIS — M79641 Pain in right hand: Secondary | ICD-10-CM

## 2023-04-13 DIAGNOSIS — L4050 Arthropathic psoriasis, unspecified: Secondary | ICD-10-CM | POA: Diagnosis not present

## 2023-04-13 DIAGNOSIS — M533 Sacrococcygeal disorders, not elsewhere classified: Secondary | ICD-10-CM

## 2023-04-13 DIAGNOSIS — Z85038 Personal history of other malignant neoplasm of large intestine: Secondary | ICD-10-CM

## 2023-04-13 DIAGNOSIS — M1120 Other chondrocalcinosis, unspecified site: Secondary | ICD-10-CM

## 2023-04-13 DIAGNOSIS — M79672 Pain in left foot: Secondary | ICD-10-CM | POA: Diagnosis not present

## 2023-04-13 DIAGNOSIS — R3915 Urgency of urination: Secondary | ICD-10-CM

## 2023-04-13 DIAGNOSIS — M79642 Pain in left hand: Secondary | ICD-10-CM | POA: Diagnosis not present

## 2023-04-13 DIAGNOSIS — M25562 Pain in left knee: Secondary | ICD-10-CM

## 2023-04-13 DIAGNOSIS — Z780 Asymptomatic menopausal state: Secondary | ICD-10-CM

## 2023-04-13 DIAGNOSIS — L900 Lichen sclerosus et atrophicus: Secondary | ICD-10-CM

## 2023-04-13 DIAGNOSIS — Z13820 Encounter for screening for osteoporosis: Secondary | ICD-10-CM

## 2023-04-13 DIAGNOSIS — L409 Psoriasis, unspecified: Secondary | ICD-10-CM

## 2023-04-13 DIAGNOSIS — E559 Vitamin D deficiency, unspecified: Secondary | ICD-10-CM

## 2023-04-13 DIAGNOSIS — I10 Essential (primary) hypertension: Secondary | ICD-10-CM

## 2023-04-13 DIAGNOSIS — M25572 Pain in left ankle and joints of left foot: Secondary | ICD-10-CM

## 2023-04-13 DIAGNOSIS — Z8719 Personal history of other diseases of the digestive system: Secondary | ICD-10-CM | POA: Diagnosis not present

## 2023-04-13 DIAGNOSIS — M249 Joint derangement, unspecified: Secondary | ICD-10-CM

## 2023-04-13 DIAGNOSIS — M7661 Achilles tendinitis, right leg: Secondary | ICD-10-CM

## 2023-04-13 DIAGNOSIS — Z8669 Personal history of other diseases of the nervous system and sense organs: Secondary | ICD-10-CM

## 2023-04-13 DIAGNOSIS — Z79899 Other long term (current) drug therapy: Secondary | ICD-10-CM | POA: Diagnosis not present

## 2023-04-13 NOTE — Patient Instructions (Signed)
 Vaccines You are taking a medication(s) that can suppress your immune system.  The following immunizations are recommended: Flu annually Covid-19  RSV Td/Tdap (tetanus, diphtheria, pertussis) every 10 years Pneumonia (Prevnar 15 then Pneumovax 23 at least 1 year apart.  Alternatively, can take Prevnar 20 without needing additional dose) Shingrix: 2 doses from 4 weeks to 6 months apart  Please check with your PCP to make sure you are up to date.   If you have signs or symptoms of an infection or start antibiotics: First, call your PCP for workup of your infection. Hold your medication through the infection, until you complete your antibiotics, and until symptoms resolve if you take the following: Injectable medication (Actemra, Benlysta, Cimzia, Cosentyx, Enbrel, Humira, Kevzara, Orencia, Remicade, Simponi, Stelara, Taltz, Tremfya) Methotrexate Leflunomide (Arava) Mycophenolate (Cellcept) Harriette Ohara, Olumiant, or Rinvoq

## 2023-04-14 NOTE — Progress Notes (Signed)
 CBC normal, creatinine is elevated.  Sodium is elevated.  Please notify patient that sodium is high.  Uric acid is elevated at 7.1.  IgA is mildly elevated CK normal, sed rate normal, rheumatoid factor negative, magnesium normal, PTH normal.  I will discuss results at the follow-up visit.  Please forward results to her PCP.

## 2023-04-19 LAB — CBC WITH DIFFERENTIAL/PLATELET
Absolute Lymphocytes: 1756 {cells}/uL (ref 850–3900)
Absolute Monocytes: 714 {cells}/uL (ref 200–950)
Basophils Absolute: 53 {cells}/uL (ref 0–200)
Basophils Relative: 0.7 %
Eosinophils Absolute: 304 {cells}/uL (ref 15–500)
Eosinophils Relative: 4 %
HCT: 44.1 % (ref 35.0–45.0)
Hemoglobin: 14.7 g/dL (ref 11.7–15.5)
MCH: 28.8 pg (ref 27.0–33.0)
MCHC: 33.3 g/dL (ref 32.0–36.0)
MCV: 86.5 fL (ref 80.0–100.0)
MPV: 12 fL (ref 7.5–12.5)
Monocytes Relative: 9.4 %
Neutro Abs: 4773 {cells}/uL (ref 1500–7800)
Neutrophils Relative %: 62.8 %
Platelets: 234 10*3/uL (ref 140–400)
RBC: 5.1 10*6/uL (ref 3.80–5.10)
RDW: 16.8 % — ABNORMAL HIGH (ref 11.0–15.0)
Total Lymphocyte: 23.1 %
WBC: 7.6 10*3/uL (ref 3.8–10.8)

## 2023-04-19 LAB — QUANTIFERON-TB GOLD PLUS
Mitogen-NIL: >10 [IU]/mL
NIL: 0.04 [IU]/mL
QuantiFERON-TB Gold Plus: NEGATIVE
TB1-NIL: 0 [IU]/mL
TB2-NIL: 0 [IU]/mL

## 2023-04-19 LAB — CYCLIC CITRUL PEPTIDE ANTIBODY, IGG: Cyclic Citrullin Peptide Ab: 16 U

## 2023-04-19 LAB — PROTEIN ELECTROPHORESIS, SERUM, WITH REFLEX
Albumin ELP: 4.1 g/dL (ref 3.8–4.8)
Alpha 1: 0.3 g/dL (ref 0.2–0.3)
Alpha 2: 0.6 g/dL (ref 0.5–0.9)
Beta 2: 0.6 g/dL — ABNORMAL HIGH (ref 0.2–0.5)
Beta Globulin: 0.5 g/dL (ref 0.4–0.6)
Gamma Globulin: 1.1 g/dL (ref 0.8–1.7)
Total Protein: 7.3 g/dL (ref 6.1–8.1)

## 2023-04-19 LAB — COMPLETE METABOLIC PANEL WITH GFR
AG Ratio: 1.4 (calc) (ref 1.0–2.5)
ALT: 15 U/L (ref 6–29)
AST: 24 U/L (ref 10–35)
Albumin: 4.4 g/dL (ref 3.6–5.1)
Alkaline phosphatase (APISO): 93 U/L (ref 37–153)
BUN/Creatinine Ratio: 15 (calc) (ref 6–22)
BUN: 17 mg/dL (ref 7–25)
CO2: 23 mmol/L (ref 20–32)
Calcium: 10.1 mg/dL (ref 8.6–10.4)
Chloride: 110 mmol/L (ref 98–110)
Creat: 1.14 mg/dL — ABNORMAL HIGH (ref 0.50–1.05)
Globulin: 3.1 g/dL (ref 1.9–3.7)
Glucose, Bld: 86 mg/dL (ref 65–99)
Potassium: 4.6 mmol/L (ref 3.5–5.3)
Sodium: 147 mmol/L — ABNORMAL HIGH (ref 135–146)
Total Bilirubin: 0.6 mg/dL (ref 0.2–1.2)
Total Protein: 7.5 g/dL (ref 6.1–8.1)

## 2023-04-19 LAB — IFE INTERPRETATION

## 2023-04-19 LAB — PARATHYROID HORMONE, INTACT (NO CA): PTH: 33 pg/mL (ref 16–77)

## 2023-04-19 LAB — HEPATITIS B SURFACE ANTIGEN: Hepatitis B Surface Ag: NONREACTIVE

## 2023-04-19 LAB — URIC ACID: Uric Acid, Serum: 7.1 mg/dL — ABNORMAL HIGH (ref 2.5–7.0)

## 2023-04-19 LAB — IGG, IGA, IGM
IgG (Immunoglobin G), Serum: 1207 mg/dL (ref 600–1540)
IgM, Serum: 77 mg/dL (ref 50–300)
Immunoglobulin A: 586 mg/dL — ABNORMAL HIGH (ref 70–320)

## 2023-04-19 LAB — SEDIMENTATION RATE: Sed Rate: 14 mm/h (ref 0–30)

## 2023-04-19 LAB — HEPATITIS B CORE ANTIBODY, IGM: Hep B C IgM: NONREACTIVE

## 2023-04-19 LAB — CK: Total CK: 36 U/L (ref 20–243)

## 2023-04-19 LAB — MAGNESIUM: Magnesium: 2.1 mg/dL (ref 1.5–2.5)

## 2023-04-19 LAB — RHEUMATOID FACTOR: Rheumatoid fact SerPl-aCnc: <10 [IU]/mL (ref <14)

## 2023-04-19 LAB — HEPATITIS C ANTIBODY: Hepatitis C Ab: NONREACTIVE

## 2023-05-04 ENCOUNTER — Ambulatory Visit (HOSPITAL_BASED_OUTPATIENT_CLINIC_OR_DEPARTMENT_OTHER)
Admission: RE | Admit: 2023-05-04 | Discharge: 2023-05-04 | Disposition: A | Discharge status: Home / Self Care | Source: Ambulatory Visit | Attending: Rheumatology | Admitting: Rheumatology

## 2023-05-04 DIAGNOSIS — M8589 Other specified disorders of bone density and structure, multiple sites: Secondary | ICD-10-CM | POA: Diagnosis not present

## 2023-05-04 DIAGNOSIS — Z780 Asymptomatic menopausal state: Secondary | ICD-10-CM | POA: Insufficient documentation

## 2023-05-04 DIAGNOSIS — Z13820 Encounter for screening for osteoporosis: Secondary | ICD-10-CM | POA: Insufficient documentation
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
## 2023-05-11 ENCOUNTER — Ambulatory Visit: Payer: BC Managed Care – PPO | Admitting: Rheumatology

## 2023-05-11 NOTE — Progress Notes (Signed)
 DEXA is suggestive of osteopenia.  Please advise patient to take calcium with vitamin D and start resistive exercises.  We will have repeat DEXA scan in 2 years.

## 2023-05-18 ENCOUNTER — Encounter: Payer: Self-pay | Admitting: Rheumatology

## 2023-05-18 ENCOUNTER — Ambulatory Visit: Payer: Self-pay | Attending: Rheumatology | Admitting: Rheumatology

## 2023-05-18 VITALS — BP 122/86 | HR 59 | Resp 14 | Ht 65.0 in | Wt 156.0 lb

## 2023-05-18 DIAGNOSIS — M7661 Achilles tendinitis, right leg: Secondary | ICD-10-CM

## 2023-05-18 DIAGNOSIS — M3500 Sicca syndrome, unspecified: Secondary | ICD-10-CM

## 2023-05-18 DIAGNOSIS — L4050 Arthropathic psoriasis, unspecified: Secondary | ICD-10-CM | POA: Diagnosis not present

## 2023-05-18 DIAGNOSIS — R7989 Other specified abnormal findings of blood chemistry: Secondary | ICD-10-CM | POA: Diagnosis not present

## 2023-05-18 DIAGNOSIS — M249 Joint derangement, unspecified: Secondary | ICD-10-CM

## 2023-05-18 DIAGNOSIS — M25572 Pain in left ankle and joints of left foot: Secondary | ICD-10-CM

## 2023-05-18 DIAGNOSIS — L409 Psoriasis, unspecified: Secondary | ICD-10-CM | POA: Diagnosis not present

## 2023-05-18 DIAGNOSIS — M79672 Pain in left foot: Secondary | ICD-10-CM

## 2023-05-18 DIAGNOSIS — M8589 Other specified disorders of bone density and structure, multiple sites: Secondary | ICD-10-CM

## 2023-05-18 DIAGNOSIS — M79671 Pain in right foot: Secondary | ICD-10-CM

## 2023-05-18 DIAGNOSIS — Z79899 Other long term (current) drug therapy: Secondary | ICD-10-CM | POA: Diagnosis not present

## 2023-05-18 DIAGNOSIS — Z8659 Personal history of other mental and behavioral disorders: Secondary | ICD-10-CM

## 2023-05-18 DIAGNOSIS — G8929 Other chronic pain: Secondary | ICD-10-CM

## 2023-05-18 DIAGNOSIS — M1120 Other chondrocalcinosis, unspecified site: Secondary | ICD-10-CM

## 2023-05-18 DIAGNOSIS — R3915 Urgency of urination: Secondary | ICD-10-CM

## 2023-05-18 DIAGNOSIS — E559 Vitamin D deficiency, unspecified: Secondary | ICD-10-CM

## 2023-05-18 DIAGNOSIS — M533 Sacrococcygeal disorders, not elsewhere classified: Secondary | ICD-10-CM

## 2023-05-18 DIAGNOSIS — M79642 Pain in left hand: Secondary | ICD-10-CM

## 2023-05-18 DIAGNOSIS — M25561 Pain in right knee: Secondary | ICD-10-CM

## 2023-05-18 DIAGNOSIS — Z8269 Family history of other diseases of the musculoskeletal system and connective tissue: Secondary | ICD-10-CM

## 2023-05-18 DIAGNOSIS — Z13820 Encounter for screening for osteoporosis: Secondary | ICD-10-CM

## 2023-05-18 DIAGNOSIS — Z8719 Personal history of other diseases of the digestive system: Secondary | ICD-10-CM

## 2023-05-18 DIAGNOSIS — M7662 Achilles tendinitis, left leg: Secondary | ICD-10-CM

## 2023-05-18 DIAGNOSIS — L900 Lichen sclerosus et atrophicus: Secondary | ICD-10-CM

## 2023-05-18 DIAGNOSIS — Z840 Family history of diseases of the skin and subcutaneous tissue: Secondary | ICD-10-CM

## 2023-05-18 DIAGNOSIS — J302 Other seasonal allergic rhinitis: Secondary | ICD-10-CM

## 2023-05-18 DIAGNOSIS — M25562 Pain in left knee: Secondary | ICD-10-CM

## 2023-05-18 DIAGNOSIS — G43E09 Chronic migraine with aura, not intractable, without status migrainosus: Secondary | ICD-10-CM

## 2023-05-18 DIAGNOSIS — M79641 Pain in right hand: Secondary | ICD-10-CM

## 2023-05-18 NOTE — Patient Instructions (Addendum)
 Standing Labs We placed an order today for your standing lab work.   Please have your standing labs drawn in June and every 3 months  Please have your labs drawn 2 weeks prior to your appointment so that the provider can discuss your lab results at your appointment, if possible.  Please note that you may see your imaging and lab results in MyChart before we have reviewed them. We will contact you once all results are reviewed. Please allow our office up to 72 hours to thoroughly review all of the results before contacting the office for clarification of your results.  WALK-IN LAB HOURS  Monday through Thursday from 8:00 am -12:30 pm and 1:00 pm-5:00 pm and Friday from 8:00 am-12:00 pm.  Patients with office visits requiring labs will be seen before walk-in labs.  You may encounter longer than normal wait times. Please allow additional time. Wait times may be shorter on  Monday and Thursday afternoons.  We do not book appointments for walk-in labs. We appreciate your patience and understanding with our staff.   Labs are drawn by [facility]. Please bring your co-pay at the time of your lab draw.  You may receive a bill from [facility] for your lab work.  Please note if you are on Hydroxychloroquine and and an order has been placed for a Hydroxychloroquine level,  you will need to have it drawn 4 hours or more after your last dose.  If you wish to have your labs drawn at another location, please call the office 24 hours in advance so we can fax the orders.  The office is located at [address], [place], [place] [address]   If you have any questions regarding directions or hours of operation,  please call [phone].   As a reminder, please drink plenty of water prior to coming for your lab work. Thanks!   Vaccines You are taking a medication(s) that can suppress your immune system.  The following immunizations are recommended: Flu annually Covid-19  Td/Tdap (tetanus, diphtheria,  pertussis) every 10 years Pneumonia (Prevnar 15 then Pneumovax 23 at least 1 year apart.  Alternatively, can take Prevnar 20 without needing additional dose) Shingrix: 2 doses from 4 weeks to 6 months apart  Please check with your PCP to make sure you are up to date.   If you have signs or symptoms of an infection or start antibiotics: First, call your PCP for workup of your infection. Hold your medication through the infection, until you complete your antibiotics, and until symptoms resolve if you take the following: Injectable medication (Actemra, Benlysta, Cimzia, Cosentyx, Enbrel, Humira, Kevzara, Orencia, Remicade, Simponi, Stelara, Taltz, Tremfya) Methotrexate Leflunomide (Arava) Mycophenolate (Cellcept) Cloria Danger, Olumiant, or Rinvoq   Apremilast Tablets What is this medication? APREMILAST (a PRE mil ast) treats autoimmune conditions, such as arthritis and psoriasis. It may also be used to treat mouth ulcers in people with a condition that causes blood vessel swelling (Behcet syndrome). It works by decreasing inflammation. This medicine may be used for other purposes; ask your health care provider or pharmacist if you have questions. COMMON BRAND NAME(S): Otezla What should I tell my care team before I take this medication? They need to know if you have any of these conditions: Dehydration Depression Kidney disease Suicidal thoughts, plans, or attempt An unusual or allergic reaction to apremilast, other medications, foods, dyes, or preservatives Pregnant or trying to get pregnant Breastfeeding How should I use this medication? Take this medication by mouth with  water. Take it as directed on the prescription label at the same time every day. Do not cut, crush, or chew this medication. Swallow the tablets whole. You can take it with or without food. If it upsets your stomach, take it with food. Keep taking it unless your care team tells you to stop. Talk to your care team about the  use of this medication in children. While it may be prescribed for children as young as 6 years for selected conditions, precautions do apply. Overdosage: If you think you have taken too much of this medicine contact a poison control center or emergency room at once. NOTE: This medicine is only for you. Do not share this medicine with others. What if I miss a dose? If you miss a dose, take it as soon as you can. If it is almost time for your next dose, take only that dose. Do not take double or extra doses. What may interact with this medication? Certain medications for seizures, such as carbamazepine, phenobarbital, phenytoin Rifampin Other medications may affect the way this medication works. Talk with your care team about all of the medications you take. They may suggest changes to your treatment plan to lower the risk of side effects and to make sure your medications work as intended. This list may not describe all possible interactions. Give your health care provider a list of all the medicines, herbs, non-prescription drugs, or dietary supplements you use. Also tell them if you smoke, drink alcohol, or use illegal drugs. Some items may interact with your medicine. What should I watch for while using this medication? Visit your care team for regular checks on your progress. Tell your care team if your symptoms do not start to get better or if they get worse. This medication may cause thoughts of suicide or depression. This includes sudden changes in mood, behaviors, or thoughts. These changes can happen at any time but are more common in the beginning of treatment or after a change in dose. Call your care team right away if you experience these thoughts or worsening depression. Check with your care team if you have severe diarrhea, nausea, and vomiting, or if you sweat a lot. The loss of too much body fluid may make it dangerous for you to take this medication. Discuss the medication with your  care team if you may be pregnant. There are benefits and risks to taking medications during pregnancy. Your care team can help you find the option that works for you. Talk to your care team before breastfeeding. Changes to your treatment plan may be needed. What side effects may I notice from receiving this medication? Side effects that you should report to your care team as soon as possible: Allergic reactions--skin rash, itching, hives, swelling of the face, lips, tongue, or throat Thoughts of suicide or self-harm, worsening mood, feelings of depression Side effects that usually do not require medical attention (report these to your care team if they continue or are bothersome): Diarrhea Headache Loss of appetite with weight loss Nausea Vomiting This list may not describe all possible side effects. Call your doctor for medical advice about side effects. You may report side effects to FDA at 1-800-FDA-1088. Where should I keep my medication? Keep out of the reach of children and pets. Store below 30 degrees C (86 degrees F). Get rid of any unused medication after the expiration date. To get rid of medications that are no longer needed or have expired: Take the medication  to a medication take-back program. Check with your pharmacy or law enforcement to find a location. If you cannot return the medication, check the label or package insert to see if the medication should be thrown out in the garbage or flushed down the toilet. If you are not sure, ask your care team. If it is safe to put it in the trash, take the medication out of the container. Mix the medication with cat litter, dirt, coffee grounds, or other unwanted substance. Seal the mixture in a bag or container. Put it in the trash. NOTE: This sheet is a summary. It may not cover all possible information. If you have questions about this medicine, talk to your doctor, pharmacist, or health care provider.  2024 Elsevier/Gold Standard  (2022-12-24 00:00:00)  Osteopenia  Osteopenia is a loss of thickness (density) inside the bones. Another name for osteopenia is low bone mass. Mild osteopenia is a normal part of aging. It is not a disease, and it does not cause symptoms. However, if you have osteopenia and continue to lose bone mass, you could develop a condition that causes the bones to become thin and break more easily (osteoporosis). Osteoporosis can cause you to lose some height, have back pain, and have a stooped posture. Although osteopenia is not a disease, making changes to your lifestyle and diet can help to prevent osteopenia from developing into osteoporosis. What are the causes? Osteopenia is caused by loss of calcium in the bones. Bones are constantly changing. Old bone cells are continually being replaced with new bone cells. This process builds new bone. The mineral calcium is needed to build new bone and maintain bone density. Bone density is usually highest around age 58. After that, most people's bodies cannot replace all the bone they have lost with new bone. What increases the risk? You are more likely to develop this condition if: You are older than age 23. You are a woman who went through menopause early. You have a long illness that keeps you in bed. You do not get enough exercise. You lack certain nutrients (malnutrition). You have an overactive thyroid gland (hyperthyroidism). You use products that contain nicotine or tobacco, such as cigarettes, e-cigarettes and chewing tobacco, or you drink a lot of alcohol. You are taking medicines that weaken the bones, such as steroids. What are the signs or symptoms? This condition does not cause any symptoms. You may have a slightly higher risk for bone breaks (fractures), so getting fractures more easily than normal may be an indication of osteopenia. How is this diagnosed? This condition may be diagnosed based on an X-ray exam that measures bone density  (dual-energy X-ray absorptiometry, or DEXA). This test can measure bone density in your hips, spine, and wrists. Osteopenia has no symptoms, so this condition is usually diagnosed after a routine bone density screening test is done for osteoporosis. This routine screening is usually done for: Women who are age 39 or older. Men who are age 68 or older. If you have risk factors for osteopenia, you may have the screening test at an earlier age. How is this treated? Making dietary and lifestyle changes can lower your risk for osteoporosis. If you have severe osteopenia that is close to becoming osteoporosis, this condition can be treated with medicines and dietary supplements such as calcium and vitamin D . These supplements help to rebuild bone density. Follow these instructions at home: Eating and drinking Eat a diet that is high in calcium and vitamin D . Calcium  is found in dairy products, beans, salmon, and leafy green vegetables like spinach and broccoli. Look for foods that have vitamin D  and calcium added to them (fortified foods), such as orange juice, cereal, and bread.  Lifestyle Do 30 minutes or more of a weight-bearing exercise every day, such as walking, jogging, or playing a sport. These types of exercises strengthen the bones. Do not use any products that contain nicotine or tobacco, such as cigarettes, e-cigarettes, and chewing tobacco. If you need help quitting, ask your health care provider. Do not drink alcohol if: Your health care provider tells you not to drink. You are pregnant, may be pregnant, or are planning to become pregnant. If you drink alcohol: Limit how much you use to: 0-1 drink a day for women. 0-2 drinks a day for men. Be aware of how much alcohol is in your drink. In the [place] one drink equals one 12 oz bottle of beer (355 mL), one 5 oz glass of wine (148 mL), or one 1 oz glass of hard liquor (44 mL). General instructions Take over-the-counter and prescription  medicines only as told by your health care provider. These include vitamins and supplements. Take precautions at home to lower your risk of falling, such as: Keeping rooms well-lit and free of clutter, such as cords. Installing safety rails on stairs. Using rubber mats in the bathroom or other areas that are often wet or slippery. Keep all follow-up visits. This is important. Contact a health care provider if: You have not had a bone density screening for osteoporosis and you are: A woman who is age 63 or older. A man who is age 17 or older. You are a postmenopausal woman who has not had a bone density screening for osteoporosis. You are older than age 72 and you want to know if you should have bone density screening for osteoporosis. Summary Osteopenia is a loss of thickness (density) inside the bones. Another name for osteopenia is low bone mass. Osteopenia is not a disease, but it may increase your risk for a condition that causes the bones to become thin and break more easily (osteoporosis). You may be at risk for osteopenia if you are older than age 84 or if you are a woman who went through early menopause. Osteopenia does not cause any symptoms, but it can be diagnosed with a bone density screening test. Dietary and lifestyle changes are the first treatment for osteopenia. These may lower your risk for osteoporosis. This information is not intended to replace advice given to you by your health care provider. Make sure you discuss any questions you have with your health care provider. Document Revised: 09/28/2022 Document Reviewed: 09/28/2022 Elsevier Patient Education  2024 [employer].

## 2023-05-21 ENCOUNTER — Telehealth: Payer: Self-pay

## 2023-05-21 DIAGNOSIS — Z79899 Other long term (current) drug therapy: Secondary | ICD-10-CM

## 2023-05-21 DIAGNOSIS — L409 Psoriasis, unspecified: Secondary | ICD-10-CM

## 2023-05-21 DIAGNOSIS — L4050 Arthropathic psoriasis, unspecified: Secondary | ICD-10-CM

## 2023-05-21 NOTE — Telephone Encounter (Signed)
 Submitted a Prior Authorization request to [facility] for OTEZLA via CoverMyMeds. Will update once we receive a response.  Key: [id number]

## 2023-05-23 ENCOUNTER — Other Ambulatory Visit (HOSPITAL_COMMUNITY): Payer: Self-pay

## 2023-05-23 MED ORDER — APREMILAST 30 MG PO TABS: 30.0000 mg | ORAL_TABLET | Freq: Two times a day (BID) | ORAL | 3 refills | Status: DC

## 2023-05-23 MED ORDER — APREMILAST 10 & 20 & 30 MG PO TBPK
ORAL_TABLET | ORAL | 0 refills | Status: DC
Start: 2023-05-23 — End: 2023-09-01

## 2023-05-23 NOTE — Telephone Encounter (Signed)
 Received notification from [facility] regarding a prior authorization for OTEZLA. Authorization has been APPROVED from 05/23/23 to 05/22/24. Approval letter sent to scan center.  Unable to run test claim because patient must fill through [facility]: [phone]  Authorization # [id number]  Enrolled patient into Otezla copay card: BIN: [id number] ID: [id number] PCN: [id number] [id number]  MyChart message sent to patient with update  Geraldene Kleine, PharmD, MPH, BCPS, CPP Clinical Pharmacist (Rheumatology and Pulmonology)

## 2023-05-25 DIAGNOSIS — L400 Psoriasis vulgaris: Secondary | ICD-10-CM | POA: Diagnosis not present

## 2023-06-01 DIAGNOSIS — N3941 Urge incontinence: Secondary | ICD-10-CM | POA: Diagnosis not present

## 2023-06-01 DIAGNOSIS — M1990 Unspecified osteoarthritis, unspecified site: Secondary | ICD-10-CM | POA: Diagnosis not present

## 2023-06-01 DIAGNOSIS — N182 Chronic kidney disease, stage 2 (mild): Secondary | ICD-10-CM | POA: Diagnosis not present

## 2023-06-01 DIAGNOSIS — L4050 Arthropathic psoriasis, unspecified: Secondary | ICD-10-CM | POA: Diagnosis not present

## 2023-06-01 DIAGNOSIS — F325 Major depressive disorder, single episode, in full remission: Secondary | ICD-10-CM | POA: Diagnosis not present

## 2023-06-01 DIAGNOSIS — Z7982 Long term (current) use of aspirin: Secondary | ICD-10-CM | POA: Diagnosis not present

## 2023-06-01 DIAGNOSIS — N3943 Post-void dribbling: Secondary | ICD-10-CM | POA: Diagnosis not present

## 2023-06-01 DIAGNOSIS — K219 Gastro-esophageal reflux disease without esophagitis: Secondary | ICD-10-CM | POA: Diagnosis not present

## 2023-06-01 DIAGNOSIS — Z8249 Family history of ischemic heart disease and other diseases of the circulatory system: Secondary | ICD-10-CM | POA: Diagnosis not present

## 2023-06-01 DIAGNOSIS — L400 Psoriasis vulgaris: Secondary | ICD-10-CM | POA: Diagnosis not present

## 2023-06-01 DIAGNOSIS — Z79620 Long term (current) use of immunosuppressive biologic: Secondary | ICD-10-CM | POA: Diagnosis not present

## 2023-06-01 DIAGNOSIS — Z87892 Personal history of anaphylaxis: Secondary | ICD-10-CM | POA: Diagnosis not present

## 2023-06-13 ENCOUNTER — Telehealth: Payer: Self-pay

## 2023-06-13 NOTE — Telephone Encounter (Signed)
 [facility] called to confirm patient is to be on both Taltz and Otezla . They also wanted clarification as to the provider's managing each drug. It was explained that our office (rheumatology) has prescribed Otezla  and is aware the patient is on Taltz as well (prescribed by dermatology) for psoriasis/PsA.   Tolu Oneal Biglow, PharmD [facility] PGY-1

## 2023-06-23 DIAGNOSIS — Z1231 Encounter for screening mammogram for malignant neoplasm of breast: Secondary | ICD-10-CM | POA: Diagnosis not present

## 2023-06-23 DIAGNOSIS — Z01419 Encounter for gynecological examination (general) (routine) without abnormal findings: Secondary | ICD-10-CM | POA: Diagnosis not present

## 2023-06-29 ENCOUNTER — Ambulatory Visit: Payer: 59 | Admitting: Nurse Practitioner

## 2023-07-08 DIAGNOSIS — M19072 Primary osteoarthritis, left ankle and foot: Secondary | ICD-10-CM | POA: Diagnosis not present
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
## 2023-08-23 ENCOUNTER — Encounter: Payer: Self-pay | Admitting: Rheumatology

## 2023-08-23 ENCOUNTER — Other Ambulatory Visit: Payer: Self-pay

## 2023-08-23 DIAGNOSIS — Z79899 Other long term (current) drug therapy: Secondary | ICD-10-CM | POA: Diagnosis not present

## 2023-08-24 ENCOUNTER — Other Ambulatory Visit: Payer: Self-pay | Admitting: Medical Genetics

## 2023-08-24 ENCOUNTER — Ambulatory Visit: Payer: Self-pay | Admitting: Rheumatology

## 2023-08-24 LAB — CBC WITH DIFFERENTIAL/PLATELET
Absolute Lymphocytes: 2639 {cells}/uL (ref 850–3900)
Absolute Monocytes: 819 {cells}/uL (ref 200–950)
Basophils Absolute: 36 {cells}/uL (ref 0–200)
Basophils Relative: 0.4 %
Eosinophils Absolute: 173 {cells}/uL (ref 15–500)
Eosinophils Relative: 1.9 %
HCT: 46.3 % — ABNORMAL HIGH (ref 35.0–45.0)
Hemoglobin: 15.3 g/dL (ref 11.7–15.5)
MCH: 32.3 pg (ref 27.0–33.0)
MCHC: 33 g/dL (ref 32.0–36.0)
MCV: 97.7 fL (ref 80.0–100.0)
MPV: 12.2 fL (ref 7.5–12.5)
Monocytes Relative: 9 %
Neutro Abs: 5433 {cells}/uL (ref 1500–7800)
Neutrophils Relative %: 59.7 %
Platelets: 211 Thousand/uL (ref 140–400)
RBC: 4.74 Million/uL (ref 3.80–5.10)
RDW: 12.5 % (ref 11.0–15.0)
Total Lymphocyte: 29 %
WBC: 9.1 Thousand/uL (ref 3.8–10.8)

## 2023-08-24 LAB — COMPREHENSIVE METABOLIC PANEL WITH GFR
AG Ratio: 1.4 (calc) (ref 1.0–2.5)
ALT: 14 U/L (ref 6–29)
AST: 21 U/L (ref 10–35)
Albumin: 4.2 g/dL (ref 3.6–5.1)
Alkaline phosphatase (APISO): 78 U/L (ref 37–153)
BUN/Creatinine Ratio: 15 (calc) (ref 6–22)
BUN: 17 mg/dL (ref 7–25)
CO2: 25 mmol/L (ref 20–32)
Calcium: 9.8 mg/dL (ref 8.6–10.4)
Chloride: 102 mmol/L (ref 98–110)
Creat: 1.17 mg/dL — ABNORMAL HIGH (ref 0.50–1.05)
Globulin: 2.9 g/dL (ref 1.9–3.7)
Glucose, Bld: 74 mg/dL (ref 65–99)
Potassium: 4.3 mmol/L (ref 3.5–5.3)
Sodium: 139 mmol/L (ref 135–146)
Total Bilirubin: 0.7 mg/dL (ref 0.2–1.2)
Total Protein: 7.1 g/dL (ref 6.1–8.1)
eGFR: 53 mL/min/1.73m2 — ABNORMAL LOW (ref >=60)

## 2023-08-24 NOTE — Progress Notes (Signed)
 CBC and CMP are stable.  Creatinine remains mildly elevated.  Please forward results to her PCP.

## 2023-08-25 NOTE — Telephone Encounter (Signed)
 Ok to continue otezla --Dr. Dolphus recommended otezla  as combination therapy.

## 2023-08-25 NOTE — Telephone Encounter (Signed)
 Contacted [facility] and advised  Ok to continue otezla --Dr. Dolphus recommended otezla  as combination therapy.   The have noted patient's chart so her Otezla  prescription does not get inactivated again.

## 2023-09-01 ENCOUNTER — Encounter: Payer: Self-pay | Admitting: Rheumatology

## 2023-09-01 ENCOUNTER — Ambulatory Visit: Attending: Rheumatology | Admitting: Rheumatology

## 2023-09-01 VITALS — BP 118/81 | HR 60 | Resp 14 | Ht 65.0 in | Wt 155.4 lb

## 2023-09-01 DIAGNOSIS — M79672 Pain in left foot: Secondary | ICD-10-CM

## 2023-09-01 DIAGNOSIS — J302 Other seasonal allergic rhinitis: Secondary | ICD-10-CM

## 2023-09-01 DIAGNOSIS — M79671 Pain in right foot: Secondary | ICD-10-CM | POA: Diagnosis not present

## 2023-09-01 DIAGNOSIS — L4050 Arthropathic psoriasis, unspecified: Secondary | ICD-10-CM

## 2023-09-01 DIAGNOSIS — M1120 Other chondrocalcinosis, unspecified site: Secondary | ICD-10-CM

## 2023-09-01 DIAGNOSIS — M249 Joint derangement, unspecified: Secondary | ICD-10-CM

## 2023-09-01 DIAGNOSIS — M3500 Sicca syndrome, unspecified: Secondary | ICD-10-CM | POA: Diagnosis not present

## 2023-09-01 DIAGNOSIS — M533 Sacrococcygeal disorders, not elsewhere classified: Secondary | ICD-10-CM | POA: Diagnosis not present

## 2023-09-01 DIAGNOSIS — M8589 Other specified disorders of bone density and structure, multiple sites: Secondary | ICD-10-CM

## 2023-09-01 DIAGNOSIS — M25562 Pain in left knee: Secondary | ICD-10-CM

## 2023-09-01 DIAGNOSIS — M79641 Pain in right hand: Secondary | ICD-10-CM | POA: Diagnosis not present

## 2023-09-01 DIAGNOSIS — R001 Bradycardia, unspecified: Secondary | ICD-10-CM

## 2023-09-01 DIAGNOSIS — M7661 Achilles tendinitis, right leg: Secondary | ICD-10-CM | POA: Diagnosis not present

## 2023-09-01 DIAGNOSIS — M25561 Pain in right knee: Secondary | ICD-10-CM

## 2023-09-01 DIAGNOSIS — G43E09 Chronic migraine with aura, not intractable, without status migrainosus: Secondary | ICD-10-CM

## 2023-09-01 DIAGNOSIS — L900 Lichen sclerosus et atrophicus: Secondary | ICD-10-CM

## 2023-09-01 DIAGNOSIS — M7662 Achilles tendinitis, left leg: Secondary | ICD-10-CM

## 2023-09-01 DIAGNOSIS — R7989 Other specified abnormal findings of blood chemistry: Secondary | ICD-10-CM | POA: Diagnosis not present

## 2023-09-01 DIAGNOSIS — L409 Psoriasis, unspecified: Secondary | ICD-10-CM

## 2023-09-01 DIAGNOSIS — Z79899 Other long term (current) drug therapy: Secondary | ICD-10-CM

## 2023-09-01 DIAGNOSIS — G8929 Other chronic pain: Secondary | ICD-10-CM

## 2023-09-01 DIAGNOSIS — M25572 Pain in left ankle and joints of left foot: Secondary | ICD-10-CM | POA: Diagnosis not present

## 2023-09-01 DIAGNOSIS — E559 Vitamin D deficiency, unspecified: Secondary | ICD-10-CM

## 2023-09-01 DIAGNOSIS — Z8719 Personal history of other diseases of the digestive system: Secondary | ICD-10-CM

## 2023-09-01 DIAGNOSIS — M79642 Pain in left hand: Secondary | ICD-10-CM

## 2023-09-01 DIAGNOSIS — Z8269 Family history of other diseases of the musculoskeletal system and connective tissue: Secondary | ICD-10-CM

## 2023-09-01 DIAGNOSIS — Z840 Family history of diseases of the skin and subcutaneous tissue: Secondary | ICD-10-CM

## 2023-09-01 DIAGNOSIS — Z8659 Personal history of other mental and behavioral disorders: Secondary | ICD-10-CM

## 2023-09-01 NOTE — Patient Instructions (Signed)
 Standing Labs We placed an order today for your standing lab work.   Please have your standing labs drawn in  October and every 3 months  Please have your labs drawn 2 weeks prior to your appointment so that the provider can discuss your lab results at your appointment, if possible.  Please note that you may see your imaging and lab results in MyChart before we have reviewed them. We will contact you once all results are reviewed. Please allow our office up to 72 hours to thoroughly review all of the results before contacting the office for clarification of your results.  WALK-IN LAB HOURS  Monday through Thursday from 8:00 am -12:30 pm and 1:00 pm-4:30 pm and Friday from 8:00 am-12:00 pm.  Patients with office visits requiring labs will be seen before walk-in labs.  You may encounter longer than normal wait times. Please allow additional time. Wait times may be shorter on  Monday and Thursday afternoons.  We do not book appointments for walk-in labs. We appreciate your patience and understanding with our staff.   Labs are drawn by [facility]. Please bring your co-pay at the time of your lab draw.  You may receive a bill from [facility] for your lab work.  Please note if you are on Hydroxychloroquine and and an order has been placed for a Hydroxychloroquine level,  you will need to have it drawn 4 hours or more after your last dose.  If you wish to have your labs drawn at another location, please call the office 24 hours in advance so we can fax the orders.  The office is located at [address], [place], [place] [address]   If you have any questions regarding directions or hours of operation,  please call [phone].   As a reminder, please drink plenty of water  prior to coming for your lab work. Thanks!  Vaccines You are taking a medication(s) that can suppress your immune system.  The following immunizations are recommended: Flu annually Covid-19  Td/Tdap (tetanus,  diphtheria, pertussis) every 10 years Pneumonia (Prevnar 15 then Pneumovax 23 at least 1 year apart.  Alternatively, can take Prevnar 20 without needing additional dose) Shingrix: 2 doses from 4 weeks to 6 months apart  Please check with your PCP to make sure you are up to date.   If you have signs or symptoms of an infection or start antibiotics: First, call your PCP for workup of your infection. Hold your medication through the infection, until you complete your antibiotics, and until symptoms resolve if you take the following: Injectable medication (Actemra, Benlysta, Cimzia, Cosentyx, Enbrel, Humira, Kevzara, Orencia , Remicade, Simponi, Stelara, Taltz, Tremfya) Methotrexate  Leflunomide  (Arava ) Mycophenolate (Cellcept) Xeljanz, Olumiant, or Rinvoq

## 2023-09-02 LAB — SJOGRENS SYNDROME-B EXTRACTABLE NUCLEAR ANTIBODY: SSB (La) (ENA) Antibody, IgG: <1 AI

## 2023-09-02 LAB — RNP ANTIBODY: Ribonucleic Protein(ENA) Antibody, IgG: <1 AI

## 2023-09-02 LAB — ANA: Anti Nuclear Antibody (ANA): NEGATIVE

## 2023-09-02 LAB — C3 AND C4
C3 Complement: 155 mg/dL (ref 83–193)
C4 Complement: 32 mg/dL (ref 15–57)

## 2023-09-02 LAB — ANTI-SMITH ANTIBODY: ENA SM Ab Ser-aCnc: <1 AI

## 2023-09-02 LAB — ANTI-DNA ANTIBODY, DOUBLE-STRANDED: ds DNA Ab: 3 [IU]/mL

## 2023-09-02 LAB — ANTI-SCLERODERMA ANTIBODY: Scleroderma (Scl-70) (ENA) Antibody, IgG: <1 AI

## 2023-09-02 LAB — SJOGRENS SYNDROME-A EXTRACTABLE NUCLEAR ANTIBODY: SSA (Ro) (ENA) Antibody, IgG: <1 AI

## 2023-09-04 ENCOUNTER — Ambulatory Visit: Payer: Self-pay | Admitting: Rheumatology

## 2023-09-04 NOTE — Progress Notes (Signed)
 All the tested autoimmune labs are negative.

## 2023-09-27 ENCOUNTER — Other Ambulatory Visit: Payer: Self-pay | Admitting: Rheumatology

## 2023-09-27 DIAGNOSIS — L409 Psoriasis, unspecified: Secondary | ICD-10-CM

## 2023-09-27 DIAGNOSIS — Z79899 Other long term (current) drug therapy: Secondary | ICD-10-CM

## 2023-09-27 DIAGNOSIS — L4050 Arthropathic psoriasis, unspecified: Secondary | ICD-10-CM

## 2023-09-27 NOTE — Telephone Encounter (Signed)
 Last Fill: 05/23/2023  Labs: 08/23/2023 CBC and CMP are stable. Creatinine remains mildly elevated.   Next Visit: 02/15/2024  Last Visit: 09/01/2023  DX: Psoriatic arthritis   Current Dose per office note 09/01/2023: Otezla  30 mg p.o. twice daily   Okay to refill Otezla ?

## 2023-10-10 DIAGNOSIS — M19072 Primary osteoarthritis, left ankle and foot: Secondary | ICD-10-CM | POA: Diagnosis not present

## 2023-11-22 ENCOUNTER — Other Ambulatory Visit: Payer: Self-pay | Admitting: Medical Genetics

## 2023-11-22 DIAGNOSIS — Z006 Encounter for examination for normal comparison and control in clinical research program: Secondary | ICD-10-CM

## 2023-12-07 ENCOUNTER — Other Ambulatory Visit: Payer: Self-pay | Admitting: Physician Assistant

## 2023-12-07 DIAGNOSIS — Z79899 Other long term (current) drug therapy: Secondary | ICD-10-CM

## 2023-12-07 DIAGNOSIS — L409 Psoriasis, unspecified: Secondary | ICD-10-CM

## 2023-12-07 DIAGNOSIS — L4050 Arthropathic psoriasis, unspecified: Secondary | ICD-10-CM

## 2023-12-26 ENCOUNTER — Other Ambulatory Visit: Payer: Self-pay | Admitting: Rheumatology

## 2023-12-26 DIAGNOSIS — L4050 Arthropathic psoriasis, unspecified: Secondary | ICD-10-CM

## 2023-12-26 DIAGNOSIS — Z79899 Other long term (current) drug therapy: Secondary | ICD-10-CM

## 2023-12-26 DIAGNOSIS — L409 Psoriasis, unspecified: Secondary | ICD-10-CM

## 2023-12-26 MED ORDER — OTEZLA 30 MG PO TABS: ORAL_TABLET | ORAL | 2 refills | Status: AC

## 2023-12-26 NOTE — Telephone Encounter (Signed)
 Patient contacted the office to request a medication refill.   1. Name of Medication: Otezla   2. How are you currently taking this medication (dosage and times per day)? One tablet 2x a day   3. What pharmacy would you like for that to be sent to? [facility]

## 2023-12-26 NOTE — Telephone Encounter (Signed)
 Last Fill: 09/27/2023  Labs: 08/23/2023 CBC and CMP are stable. Creatinine remains mildly elevated.   Next Visit: 02/15/2024  Last Visit: 09/01/2023  DX: Psoriatic arthritis   Current Dose per office note on 09/01/2023: Otezla  30 mg p.o. twice daily   Okay to refill Otezla ?

## 2023-12-28 LAB — GENECONNECT MOLECULAR SCREEN: Genetic Analysis Overall Interpretation: NEGATIVE

## 2023-12-29 ENCOUNTER — Encounter: Payer: 59 | Admitting: Nurse Practitioner

## 2024-01-10 DIAGNOSIS — M19072 Primary osteoarthritis, left ankle and foot: Secondary | ICD-10-CM | POA: Diagnosis not present
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
## 2024-02-07 ENCOUNTER — Other Ambulatory Visit: Payer: Self-pay | Admitting: *Deleted

## 2024-02-07 DIAGNOSIS — Z79899 Other long term (current) drug therapy: Secondary | ICD-10-CM

## 2024-02-07 LAB — COMPREHENSIVE METABOLIC PANEL WITH GFR
AG Ratio: 1.3 (calc) (ref 1.0–2.5)
ALT: 14 U/L (ref 6–29)
AST: 20 U/L (ref 10–35)
Albumin: 3.9 g/dL (ref 3.6–5.1)
Alkaline phosphatase (APISO): 89 U/L (ref 37–153)
BUN: 16 mg/dL (ref 7–25)
CO2: 28 mmol/L (ref 20–32)
Calcium: 9.5 mg/dL (ref 8.6–10.4)
Chloride: 103 mmol/L (ref 98–110)
Creat: 1.04 mg/dL (ref 0.50–1.05)
Globulin: 3 g/dL (ref 1.9–3.7)
Glucose, Bld: 86 mg/dL (ref 65–99)
Potassium: 5 mmol/L (ref 3.5–5.3)
Sodium: 140 mmol/L (ref 135–146)
Total Bilirubin: 0.6 mg/dL (ref 0.2–1.2)
Total Protein: 6.9 g/dL (ref 6.1–8.1)
eGFR: 61 mL/min/1.73m2

## 2024-02-07 LAB — CBC WITH DIFFERENTIAL/PLATELET
Absolute Lymphocytes: 2618 {cells}/uL (ref 850–3900)
Absolute Monocytes: 689 {cells}/uL (ref 200–950)
Basophils Absolute: 51 {cells}/uL (ref 0–200)
Basophils Relative: 0.6 %
Eosinophils Absolute: 230 {cells}/uL (ref 15–500)
Eosinophils Relative: 2.7 %
HCT: 43.1 % (ref 35.9–46.0)
Hemoglobin: 14.7 g/dL (ref 11.7–15.5)
MCH: 33 pg (ref 27.0–33.0)
MCHC: 34.1 g/dL (ref 31.6–35.4)
MCV: 96.6 fL (ref 81.4–101.7)
MPV: 12.1 fL (ref 7.5–12.5)
Monocytes Relative: 8.1 %
Neutro Abs: 4913 {cells}/uL (ref 1500–7800)
Neutrophils Relative %: 57.8 %
Platelets: 261 Thousand/uL (ref 140–400)
RBC: 4.46 Million/uL (ref 3.80–5.10)
RDW: 11.9 % (ref 11.0–15.0)
Total Lymphocyte: 30.8 %
WBC: 8.5 Thousand/uL (ref 3.8–10.8)

## 2024-02-08 ENCOUNTER — Ambulatory Visit: Payer: Self-pay | Admitting: Rheumatology

## 2024-02-15 ENCOUNTER — Telehealth: Payer: Self-pay | Admitting: Emergency Medicine

## 2024-02-15 ENCOUNTER — Ambulatory Visit: Attending: Rheumatology | Admitting: Rheumatology

## 2024-02-15 ENCOUNTER — Encounter: Payer: Self-pay | Admitting: Rheumatology

## 2024-02-15 VITALS — BP 136/78 | HR 40 | Temp 97.8°F | Resp 14 | Ht 65.0 in | Wt 147.3 lb

## 2024-02-15 DIAGNOSIS — Z79899 Other long term (current) drug therapy: Secondary | ICD-10-CM | POA: Diagnosis not present

## 2024-02-15 DIAGNOSIS — M533 Sacrococcygeal disorders, not elsewhere classified: Secondary | ICD-10-CM

## 2024-02-15 DIAGNOSIS — M25572 Pain in left ankle and joints of left foot: Secondary | ICD-10-CM

## 2024-02-15 DIAGNOSIS — M79641 Pain in right hand: Secondary | ICD-10-CM

## 2024-02-15 DIAGNOSIS — Z8719 Personal history of other diseases of the digestive system: Secondary | ICD-10-CM

## 2024-02-15 DIAGNOSIS — M79671 Pain in right foot: Secondary | ICD-10-CM | POA: Diagnosis not present

## 2024-02-15 DIAGNOSIS — J302 Other seasonal allergic rhinitis: Secondary | ICD-10-CM

## 2024-02-15 DIAGNOSIS — M25562 Pain in left knee: Secondary | ICD-10-CM

## 2024-02-15 DIAGNOSIS — M8589 Other specified disorders of bone density and structure, multiple sites: Secondary | ICD-10-CM

## 2024-02-15 DIAGNOSIS — Z8659 Personal history of other mental and behavioral disorders: Secondary | ICD-10-CM

## 2024-02-15 DIAGNOSIS — R001 Bradycardia, unspecified: Secondary | ICD-10-CM

## 2024-02-15 DIAGNOSIS — L900 Lichen sclerosus et atrophicus: Secondary | ICD-10-CM

## 2024-02-15 DIAGNOSIS — M25561 Pain in right knee: Secondary | ICD-10-CM

## 2024-02-15 DIAGNOSIS — L4050 Arthropathic psoriasis, unspecified: Secondary | ICD-10-CM

## 2024-02-15 DIAGNOSIS — M1120 Other chondrocalcinosis, unspecified site: Secondary | ICD-10-CM | POA: Diagnosis not present

## 2024-02-15 DIAGNOSIS — L409 Psoriasis, unspecified: Secondary | ICD-10-CM

## 2024-02-15 DIAGNOSIS — M3500 Sicca syndrome, unspecified: Secondary | ICD-10-CM | POA: Diagnosis not present

## 2024-02-15 DIAGNOSIS — Z840 Family history of diseases of the skin and subcutaneous tissue: Secondary | ICD-10-CM

## 2024-02-15 DIAGNOSIS — M7661 Achilles tendinitis, right leg: Secondary | ICD-10-CM | POA: Diagnosis not present

## 2024-02-15 DIAGNOSIS — M7662 Achilles tendinitis, left leg: Secondary | ICD-10-CM

## 2024-02-15 DIAGNOSIS — G8929 Other chronic pain: Secondary | ICD-10-CM

## 2024-02-15 DIAGNOSIS — R7989 Other specified abnormal findings of blood chemistry: Secondary | ICD-10-CM | POA: Diagnosis not present

## 2024-02-15 DIAGNOSIS — M249 Joint derangement, unspecified: Secondary | ICD-10-CM

## 2024-02-15 DIAGNOSIS — G43E09 Chronic migraine with aura, not intractable, without status migrainosus: Secondary | ICD-10-CM

## 2024-02-15 DIAGNOSIS — M79642 Pain in left hand: Secondary | ICD-10-CM

## 2024-02-15 DIAGNOSIS — E559 Vitamin D deficiency, unspecified: Secondary | ICD-10-CM

## 2024-02-15 DIAGNOSIS — M79672 Pain in left foot: Secondary | ICD-10-CM

## 2024-02-15 DIAGNOSIS — Z8269 Family history of other diseases of the musculoskeletal system and connective tissue: Secondary | ICD-10-CM

## 2024-02-15 NOTE — Patient Instructions (Signed)
 Standing Labs We placed an order today for your standing lab work.   Please have your standing labs drawn in April and every 3 months  Please have your labs drawn 2 weeks prior to your appointment so that the provider can discuss your lab results at your appointment, if possible.  Please note that you may see your imaging and lab results in MyChart before we have reviewed them. We will contact you once all results are reviewed. Please allow our office up to 72 hours to thoroughly review all of the results before contacting the office for clarification of your results.  WALK-IN LAB HOURS  Monday through Thursday from 8:00 am - 4:30 pm and Friday from 8:00 am-12:00 pm.  Patients with office visits requiring labs will be seen before walk-in labs.  You may encounter longer than normal wait times. Please allow additional time. Wait times may be shorter on  Monday and Thursday afternoons.  We do not book appointments for walk-in labs. We appreciate your patience and understanding with our staff.   Labs are drawn by [facility]. Please bring your co-pay at the time of your lab draw.  You may receive a bill from [facility] for your lab work.  Please note if you are on Hydroxychloroquine and and an order has been placed for a Hydroxychloroquine level,  you will need to have it drawn 4 hours or more after your last dose.  If you wish to have your labs drawn at another location, please call the office 24 hours in advance so we can fax the orders.  The office is located at [address], [place], [place] [address]   If you have any questions regarding directions or hours of operation,  please call [phone].   As a reminder, please drink plenty of water prior to coming for your lab work. Thanks!   Vaccines You are taking a medication(s) that can suppress your immune system.  The following immunizations are recommended: Flu annually Covid-19  Td/Tdap (tetanus, diphtheria, pertussis) every  10 years Pneumonia (Prevnar 15 then Pneumovax 23 at least 1 year apart.  Alternatively, can take Prevnar 20 without needing additional dose) Shingrix: 2 doses from 4 weeks to 6 months apart  Please check with your PCP to make sure you are up to date.   If you have signs or symptoms of an infection or start antibiotics: First, call your PCP for workup of your infection. Hold your medication through the infection, until you complete your antibiotics, and until symptoms resolve if you take the following: Injectable medication (Actemra, Benlysta, Cimzia, Cosentyx, Enbrel, Humira, Kevzara, Orencia, Remicade, Simponi, Stelara, Taltz, Tremfya) Methotrexate Leflunomide (Arava) Mycophenolate (Cellcept) Earma, Olumiant, or Rinvoq

## 2024-02-15 NOTE — Telephone Encounter (Signed)
 Pt was told to come to the office due to a hr of 40, her bp was 136/78 at the Rheumatologist office. HR stayed in the 40's for about 30 minutes. Recent lab work on 02/07/24= All normal values She reports that her hr usually runs in the 50's.   Denies any dizziness, shortness of breath, nausea, blurred vision. No syncope.  HX: She reports that she did pass out last year/last spring  Checked pulse ox and Hr is 70 and O2 is 99%, a few minutes later= hr 79 and O2= 97  She does not take any BP meds.   Instructed to stay well hydrated. She reports that she is well hydrated and has been eating regularly.   Made an appt to see an APP 02/22/24. Given ER precautions. She verbalized understanding.

## 2024-02-22 ENCOUNTER — Ambulatory Visit

## 2024-02-22 ENCOUNTER — Ambulatory Visit: Attending: Internal Medicine | Admitting: Emergency Medicine

## 2024-02-22 VITALS — BP 100/70 | HR 84 | Ht 65.0 in | Wt 145.0 lb

## 2024-02-22 DIAGNOSIS — R001 Bradycardia, unspecified: Secondary | ICD-10-CM

## 2024-02-22 DIAGNOSIS — R55 Syncope and collapse: Secondary | ICD-10-CM | POA: Diagnosis not present

## 2024-02-22 DIAGNOSIS — I493 Ventricular premature depolarization: Secondary | ICD-10-CM

## 2024-02-22 NOTE — Progress Notes (Signed)
 " Cardiology Office Note:    Date:  02/22/2024  ID:  Lauren Forbes, DOB 28-Dec-1960, MRN [id number] PCP: Lauren Charleston, MD  [facility] Providers Cardiologist:  Lauren ONEIDA Decent, MD       Patient Profile:       Chief Complaint: Acute visit for an episode of bradycardia History of Present Illness:  Lauren Forbes is a 64 y.o. female with visit-pertinent history of GERD, hemochromatosis, syncope, psoriatic arthritis, psoriasis, osteoarthritis  Patient established with cardiology service on 01/10/2023 with Dr. Decent for evaluation of syncope.  She had experienced a syncopal episode approximately 2 hours after donating blood.  She reported feeling dizzy and lightheaded prior to the episode.  Her hemoglobin was noted to be low at 9, despite a predonation finger prick test showing a level of 12.5.  Her blood pressure was low when checked by EMS and there was no seizure-like activity.  Her symptoms resolved within minutes and no further episodes were reported.  Per Dr. Rosalynn syncope was likely vasovagal episode after blood donation in the setting of anemia.  She had had no recurrent episodes.  EKG showed sinus bradycardia with occasional premature ventricular complexes.  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   Discussed the use of AI scribe software for clinical note transcription with the patient, who gave verbal consent to proceed.  History of Present Illness Lauren Forbes is a 64 year old female who presents with bradycardia. She was referred by her rheumatologist for evaluation of bradycardia.  She is being evaluated for bradycardia after a heart rate in the 40s was noted at a recent rheumatology visit.  She occasionally feels dizzy when standing quickly occurring approximately 3  times a year but had no symptoms when her heart rate was low. Her blood pressure is typically low, and her heart rate is usually in the 50s to 60s. She has had no recent syncope, lightheadedness, or dizziness.  She denies any chest pains, dyspnea orthopnea, PND.  She has PVCs on prior EKGs but is asymptomatic. 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  Her father also has low heart rate and low blood pressure, with resting heart rate usually in the 50s.  She takes phentermine, half a tablet in the morning for weight control, and vitamin D  and B12 supplements. She drinks caffeine and alcohol socially.   Review of systems:  Please see the history of present illness. All other systems are reviewed and otherwise negative.      Studies Reviewed:    EKG Interpretation Date/Time:  Wednesday February 22 2024 14:46:58 EST Ventricular Rate:  84 PR Interval:  168 QRS Duration:  84 QT Interval:  358 QTC Calculation: 423 R Axis:   54  Text Interpretation: Sinus rhythm with occasional Premature ventricular complexes Low voltage QRS When compared with ECG of 10-Jan-2023 09:27, Vent. rate has increased BY  29 BPM Minimal criteria for Inferior infarct are no longer Present Nonspecific T wave abnormality, improved in Anterior leads Confirmed by Lauren Forbes [phone]) on 02/22/2024 5:09:15 PM    Echocardiogram 01/25/2023  1. Left ventricular ejection fraction, by estimation, is 50%. The left  ventricle has mildly decreased function. The left ventricle demonstrates  global hypokinesis. Left ventricular diastolic parameters are consistent  with Grade I diastolic dysfunction  (impaired relaxation).   2. Right ventricular systolic function is mildly reduced. The right  ventricular size is normal. There is normal pulmonary artery systolic  pressure. The estimated right ventricular systolic pressure is 22.0 mmHg.   3. The mitral valve is normal in structure.  Mild mitral valve  regurgitation. No evidence of mitral stenosis.   4. The aortic valve is tricuspid. Aortic valve regurgitation is trivial.  No aortic stenosis is present.   5. Aortic dilatation noted. There is mild dilatation of the ascending  aorta, measuring 39 mm.   6. The inferior vena cava is normal in size with greater than 50%  respiratory variability, suggesting right atrial pressure of 3 mmHg.   Risk Assessment/Calculations:              Physical Exam:   VS:  BP 100/70 (BP Location: Right Arm, Patient Position: Sitting, Cuff Size: Normal)   Pulse 84   Ht 5' 5 (1.651 m)   Wt 145 lb (65.8 kg)   LMP  (LMP Unknown)   BMI 24.13 kg/m    Wt Readings from Last 3 Encounters:  02/22/24 145 lb (65.8 kg)  02/15/24 147 lb 4.8 oz (66.8 kg)  09/01/23 155 lb 6.4 oz (70.5 kg)    GEN: Well nourished, well developed in no acute distress NECK: No JVD; No carotid bruits CARDIAC: RRR, no murmurs, rubs, gallops RESPIRATORY:  Clear to auscultation without rales, wheezing or rhonchi  ABDOMEN: Soft, non-tender, non-distended EXTREMITIES:  No edema; No acute deformity      Assessment and Plan:  Bradycardia Heart rate was noted to be in the 40s on 02/15/2024 at her rheumatology office visit on a pulse oximeter.  She was asymptomatic at that time - Today she remains asymptomatic without syncope, presyncope, lightheadedness, dizziness, dyspnea, or fatigue - Heart rate today is 84 bpm and EKG shows NSR with PVCs - She does have several EKGs in the past showing PVCs which can read as bradycardia on a pulse oximeter - Will plan for 7-day ZIO to monitor for arrhythmia, AV block, and PVC burden - Recent CBC, CMP, MAG, and TSH are normal  Syncope History of syncope on 10/2023 after blood donation in the setting of anemia thought to be a vasovagal episode Echocardiogram 12/2022 showed LVEF 50%, grade 1 DD, RV mildly reduced, mild MR - Today she is without further episodes of recurrent syncope or  presyncope - Stable.  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  No treatment required at this time      Dispo:  Return if symptoms worsen or fail to improve.  Signed, Lum LITTIE Louis, NP  "

## 2024-02-22 NOTE — Patient Instructions (Addendum)
 Medication Instructions:  NO CHANGES  Lab Work: NONE TO BE DONE TODAY.  Testing/Procedures: GEOFFRY HEWS- Long Term Monitor Instructions  Your physician has requested you wear a ZIO patch monitor for 7 days.  This is a single patch monitor. [employer] supplies one patch monitor per enrollment. Additional stickers are not available. Please do not apply patch if you will be having a Nuclear Stress Test,  Echocardiogram, Cardiac CT, MRI, or Chest Xray during the period you would be wearing the  monitor. The patch cannot be worn during these tests. You cannot remove and re-apply the  ZIO XT patch monitor.  Your ZIO patch monitor will be mailed 3 day USPS to your address on file. It may take 3-5 days  to receive your monitor after you have been enrolled.  Once you have received your monitor, please review the enclosed instructions. Your monitor  has already been registered assigning a specific monitor serial # to you.  Billing and Patient Assistance Program Information  We have supplied [employer] with any of your insurance information on file for billing purposes. [employer] offers a sliding scale Patient Assistance Program for patients that do not have  insurance, or whose insurance does not completely cover the cost of the ZIO monitor.  You must apply for the Patient Assistance Program to qualify for this discounted rate.  To apply, please call [employer] at [phone], select option 4, select option 2, ask to apply for  Patient Assistance Program. Meredeth will ask your household income, and how many people  are in your household. They will quote your out-of-pocket cost based on that information.  [employer] will also be able to set up a 67-month, interest-free payment plan if needed.  Applying the monitor   Shave hair from upper left chest.  Hold abrader disc by orange tab. Rub abrader in 40 strokes over the upper left chest as  indicated in your monitor instructions.  Clean area with 4 enclosed  alcohol pads. Let dry.  Apply patch as indicated in monitor instructions. Patch will be placed under collarbone on left  side of chest with arrow pointing upward.  Rub patch adhesive wings for 2 minutes. Remove white label marked 1. Remove the white  label marked 2. Rub patch adhesive wings for 2 additional minutes.  While looking in a mirror, press and release button in center of patch. A small green light will  flash 3-4 times. This will be your only indicator that the monitor has been turned on.  Do not shower for the first 24 hours. You may shower after the first 24 hours.  Press the button if you feel a symptom. You will hear a small click. Record Date, Time and  Symptom in the Patient Logbook.  When you are ready to remove the patch, follow instructions on the last 2 pages of Patient  Logbook. Stick patch monitor onto the last page of Patient Logbook.  Place Patient Logbook in the blue and white box. Use locking tab on box and tape box closed  securely. The blue and white box has prepaid postage on it. Please place it in the mailbox as  soon as possible. Your physician should have your test results approximately 7 days after the  monitor has been mailed back to [facility].  Call [facility] Customer Care at [phone] if you have questions regarding  your ZIO XT patch monitor. Call them immediately if you see an orange light blinking on your  monitor.  If your monitor falls  off in less than 4 days, contact our Monitor department at [phone].  If your monitor becomes loose or falls off after 4 days call [employer] at [phone] for  suggestions on securing your monitor   Follow-Up: At [facility], you and your health needs are our priority.  As part of our continuing mission to provide you with exceptional heart care, our providers are all part of one team.  This team includes your primary Cardiologist (physician) and Advanced Practice Providers or APPs  (Physician Assistants and Nurse Practitioners) who all work together to provide you with the care you need, when you need it.  Your next appointment:   AS NEEDED  Provider:   Darryle Forbes Decent, MD     Other Instructions:

## 2024-02-22 NOTE — Progress Notes (Unsigned)
 Enrolled patient for a 7 day Zio XT monitor to be mailed to patients home   Lauren Forbes

## 2024-02-23 ENCOUNTER — Telehealth: Payer: Self-pay

## 2024-02-23 NOTE — Telephone Encounter (Signed)
 Tamika from [employer] contacted the office stating the patient's Ote[employer]  needs a new prior authorization. Call back number is [phone]. Please advise.

## 2024-02-24 NOTE — Telephone Encounter (Signed)
 Submitted a Prior Authorization request to [facility] for OTEZLA  via CoverMyMeds. Will update once we receive a response.  Key: [id number]

## 2024-02-28 NOTE — Telephone Encounter (Signed)
 Received a fax regarding Prior Authorization from [facility] for OTEZ[employer] . Authorization has been DENIED because paitent must not be using Otezla  in combination with other biologics (patent is on [facility])  Case #: [id number]  Urgent e-appeal submitted on CMM  Sherry Pennant, PharmD, MPH, BCPS, CPP Clinical Pharmacist

## 2024-07-18 ENCOUNTER — Ambulatory Visit: Admitting: Rheumatology
